# Patient Record
Sex: Female | Born: 1990 | Race: Black or African American | Hispanic: No | Marital: Single | Smoking: Never smoker
Health system: Southern US, Community
[De-identification: ages and names within clinical notes are randomized; demographics above are authoritative.]

## PROBLEM LIST (undated history)

## (undated) DIAGNOSIS — F4310 Post-traumatic stress disorder, unspecified: Secondary | ICD-10-CM

## (undated) DIAGNOSIS — F32A Depression, unspecified: Secondary | ICD-10-CM

## (undated) DIAGNOSIS — F840 Autistic disorder: Secondary | ICD-10-CM

## (undated) DIAGNOSIS — I10 Essential (primary) hypertension: Secondary | ICD-10-CM

## (undated) DIAGNOSIS — F419 Anxiety disorder, unspecified: Secondary | ICD-10-CM

## (undated) DIAGNOSIS — F909 Attention-deficit hyperactivity disorder, unspecified type: Secondary | ICD-10-CM

## (undated) DIAGNOSIS — K2970 Gastritis, unspecified, without bleeding: Secondary | ICD-10-CM

## (undated) HISTORY — DX: Essential (primary) hypertension: I10

## (undated) HISTORY — DX: Attention-deficit hyperactivity disorder, unspecified type: F90.9

## (undated) HISTORY — DX: Autistic disorder: F84.0

## (undated) HISTORY — DX: Depression, unspecified: F32.A

## (undated) HISTORY — DX: Anxiety disorder, unspecified: F41.9

## (undated) HISTORY — DX: Post-traumatic stress disorder, unspecified: F43.10

## (undated) HISTORY — DX: Gastritis, unspecified, without bleeding: K29.70

## (undated) HISTORY — PX: OTHER SURGICAL HISTORY: SHX169

---

## 2010-06-20 ENCOUNTER — Emergency Department (HOSPITAL_COMMUNITY): Admission: EM | Admit: 2010-06-20 | Discharge: 2010-06-20 | Payer: Self-pay | Admitting: Emergency Medicine

## 2010-12-21 LAB — URINALYSIS, ROUTINE W REFLEX MICROSCOPIC
Bilirubin Urine: NEGATIVE
Glucose, UA: NEGATIVE mg/dL
Hgb urine dipstick: NEGATIVE
Ketones, ur: NEGATIVE mg/dL
Nitrite: NEGATIVE
Protein, ur: NEGATIVE mg/dL
Specific Gravity, Urine: 1.021 (ref 1.005–1.030)
Urobilinogen, UA: 1 mg/dL (ref 0.0–1.0)
pH: 6 (ref 5.0–8.0)

## 2011-05-25 ENCOUNTER — Emergency Department (HOSPITAL_COMMUNITY)
Admission: EM | Admit: 2011-05-25 | Discharge: 2011-05-25 | Disposition: A | Discharge status: Home / Self Care | Payer: Medicaid Other | Attending: Emergency Medicine | Admitting: Emergency Medicine

## 2011-05-25 DIAGNOSIS — B9689 Other specified bacterial agents as the cause of diseases classified elsewhere: Secondary | ICD-10-CM | POA: Insufficient documentation

## 2011-05-25 DIAGNOSIS — N760 Acute vaginitis: Secondary | ICD-10-CM | POA: Insufficient documentation

## 2011-05-25 DIAGNOSIS — N739 Female pelvic inflammatory disease, unspecified: Secondary | ICD-10-CM | POA: Insufficient documentation

## 2011-05-25 DIAGNOSIS — A499 Bacterial infection, unspecified: Secondary | ICD-10-CM | POA: Insufficient documentation

## 2011-05-25 LAB — COMPREHENSIVE METABOLIC PANEL
Alkaline Phosphatase: 87 U/L (ref 39–117)
BUN: 9 mg/dL (ref 6–23)
CO2: 25 mEq/L (ref 19–32)
GFR calc Af Amer: >60 mL/min (ref >60)
GFR calc non Af Amer: >60 mL/min (ref >60)
Glucose, Bld: 84 mg/dL (ref 70–99)
Potassium: 3.6 mEq/L (ref 3.5–5.1)
Total Protein: 8.4 g/dL — ABNORMAL HIGH (ref 6.0–8.3)

## 2011-05-25 LAB — DIFFERENTIAL
Basophils Absolute: 0 10*3/uL (ref 0.0–0.1)
Basophils Relative: 0 % (ref 0–1)
Eosinophils Absolute: 0.1 10*3/uL (ref 0.0–0.7)
Eosinophils Relative: 1 % (ref 0–5)

## 2011-05-25 LAB — URINALYSIS, ROUTINE W REFLEX MICROSCOPIC
Nitrite: NEGATIVE
Protein, ur: NEGATIVE mg/dL
Specific Gravity, Urine: 1.021 (ref 1.005–1.030)
Urobilinogen, UA: 1 mg/dL (ref 0.0–1.0)

## 2011-05-25 LAB — WET PREP, GENITAL
Trich, Wet Prep: NONE SEEN
Yeast Wet Prep HPF POC: NONE SEEN

## 2011-05-25 LAB — POCT PREGNANCY, URINE: Preg Test, Ur: NEGATIVE

## 2011-05-25 LAB — CBC
Platelets: 336 10*3/uL (ref 150–400)
RDW: 13.3 % (ref 11.5–15.5)
WBC: 7.9 10*3/uL (ref 4.0–10.5)

## 2011-05-26 LAB — RPR: RPR Ser Ql: NONREACTIVE

## 2012-04-16 ENCOUNTER — Ambulatory Visit: Payer: Medicaid Other | Admitting: Physical Therapy

## 2012-04-17 ENCOUNTER — Ambulatory Visit: Payer: Medicaid Other | Attending: Family Medicine | Admitting: Physical Therapy

## 2012-04-17 DIAGNOSIS — M542 Cervicalgia: Secondary | ICD-10-CM | POA: Insufficient documentation

## 2012-04-17 DIAGNOSIS — M546 Pain in thoracic spine: Secondary | ICD-10-CM | POA: Insufficient documentation

## 2012-04-17 DIAGNOSIS — M2569 Stiffness of other specified joint, not elsewhere classified: Secondary | ICD-10-CM | POA: Insufficient documentation

## 2012-04-17 DIAGNOSIS — IMO0001 Reserved for inherently not codable concepts without codable children: Secondary | ICD-10-CM | POA: Insufficient documentation

## 2012-04-23 ENCOUNTER — Ambulatory Visit: Payer: Medicaid Other | Admitting: Physical Therapy

## 2012-04-24 ENCOUNTER — Ambulatory Visit: Payer: Medicaid Other | Admitting: Physical Therapy

## 2012-04-30 ENCOUNTER — Ambulatory Visit: Payer: Medicaid Other | Admitting: Physical Therapy

## 2012-05-09 ENCOUNTER — Ambulatory Visit: Payer: Medicaid Other | Attending: Family Medicine | Admitting: Physical Therapy

## 2012-05-09 DIAGNOSIS — M2569 Stiffness of other specified joint, not elsewhere classified: Secondary | ICD-10-CM | POA: Insufficient documentation

## 2012-05-09 DIAGNOSIS — IMO0001 Reserved for inherently not codable concepts without codable children: Secondary | ICD-10-CM | POA: Insufficient documentation

## 2012-05-09 DIAGNOSIS — M542 Cervicalgia: Secondary | ICD-10-CM | POA: Insufficient documentation

## 2012-05-09 DIAGNOSIS — M546 Pain in thoracic spine: Secondary | ICD-10-CM | POA: Insufficient documentation

## 2012-05-13 ENCOUNTER — Ambulatory Visit
Admission: RE | Admit: 2012-05-13 | Discharge: 2012-05-13 | Disposition: A | Discharge status: Home / Self Care | Payer: Medicaid Other | Source: Ambulatory Visit | Attending: Nurse Practitioner | Admitting: Nurse Practitioner

## 2012-05-13 ENCOUNTER — Other Ambulatory Visit: Payer: Self-pay | Admitting: Nurse Practitioner

## 2012-05-13 DIAGNOSIS — M533 Sacrococcygeal disorders, not elsewhere classified: Secondary | ICD-10-CM

## 2012-05-13 DIAGNOSIS — N6489 Other specified disorders of breast: Secondary | ICD-10-CM

## 2012-05-15 ENCOUNTER — Ambulatory Visit: Payer: Medicaid Other | Admitting: Physical Therapy

## 2012-05-21 ENCOUNTER — Ambulatory Visit: Payer: Medicaid Other | Admitting: Physical Therapy

## 2013-08-03 ENCOUNTER — Emergency Department (HOSPITAL_COMMUNITY)
Admission: EM | Admit: 2013-08-03 | Discharge: 2013-08-03 | Disposition: A | Discharge status: Home / Self Care | Payer: Medicaid Other | Attending: Emergency Medicine | Admitting: Emergency Medicine

## 2013-08-03 ENCOUNTER — Emergency Department (HOSPITAL_COMMUNITY): Payer: Medicaid Other

## 2013-08-03 ENCOUNTER — Encounter (HOSPITAL_COMMUNITY): Payer: Self-pay | Admitting: Emergency Medicine

## 2013-08-03 DIAGNOSIS — N898 Other specified noninflammatory disorders of vagina: Secondary | ICD-10-CM | POA: Insufficient documentation

## 2013-08-03 DIAGNOSIS — R1032 Left lower quadrant pain: Secondary | ICD-10-CM | POA: Insufficient documentation

## 2013-08-03 DIAGNOSIS — R1031 Right lower quadrant pain: Secondary | ICD-10-CM | POA: Insufficient documentation

## 2013-08-03 DIAGNOSIS — Z3202 Encounter for pregnancy test, result negative: Secondary | ICD-10-CM | POA: Insufficient documentation

## 2013-08-03 DIAGNOSIS — N83209 Unspecified ovarian cyst, unspecified side: Secondary | ICD-10-CM | POA: Insufficient documentation

## 2013-08-03 DIAGNOSIS — R102 Pelvic and perineal pain: Secondary | ICD-10-CM

## 2013-08-03 LAB — URINALYSIS, ROUTINE W REFLEX MICROSCOPIC
Bilirubin Urine: NEGATIVE
Glucose, UA: NEGATIVE mg/dL
Hgb urine dipstick: NEGATIVE
Ketones, ur: NEGATIVE mg/dL
Leukocytes, UA: NEGATIVE
Protein, ur: NEGATIVE mg/dL
pH: 6 (ref 5.0–8.0)

## 2013-08-03 LAB — WET PREP, GENITAL
Clue Cells Wet Prep HPF POC: NONE SEEN
Trich, Wet Prep: NONE SEEN
Yeast Wet Prep HPF POC: NONE SEEN

## 2013-08-03 MED ORDER — OXYCODONE-ACETAMINOPHEN 5-325 MG PO TABS: 1.0000 | ORAL_TABLET | ORAL | Status: DC | PRN

## 2013-08-03 MED ORDER — OXYCODONE-ACETAMINOPHEN 5-325 MG PO TABS
2.0000 | ORAL_TABLET | Freq: Once | ORAL | Status: AC
  Administered 2013-08-03: 2 via ORAL
  Filled 2013-08-03: qty 2

## 2013-08-03 NOTE — ED Notes (Signed)
Patient returning from u/s at this time 

## 2013-08-03 NOTE — ED Provider Notes (Signed)
CSN: [id number]     Arrival date & time 08/03/13  1248 History   First MD Initiated Contact with Patient 08/03/13 1558     Chief Complaint  Patient presents with  . Abdominal Pain  . Vaginal Discharge   (Consider location/radiation/quality/duration/timing/severity/associated sxs/prior Treatment) HPI  Kylie Huerta 22 year old female presents with chief complaint of R lower quadrant abdominal pain and vaginal discharge.  Patient states that she was diagnosed back in 2012 with bacterial vaginosis.  Patient states at that time she was told she may have pelvic inflammatory disease was only ever treated with Flagyl.  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  Her primary care physician continues to treat her with Flagyl.  Patient states that for the past 2 weeks she has had increasing left lower quadrants abdominal pain that is colicky, severe, worsening.  Patient denies any urinary symptoms.  She denies any fevers, chills, nausea, vomiting, myalgias.  Patient states she is actually active with one partner.   History reviewed. No pertinent past medical history. History reviewed. No pertinent past surgical history. History reviewed. No pertinent family history. History  Substance Use Topics  . Smoking status: Never Smoker   . Smokeless tobacco: Not on file  . Alcohol Use: No   OB History   Grav Para Term Preterm Abortions TAB SAB Ect Mult Living                 Review of Systems Ten systems reviewed and are negative for acute change, except as noted in the HPI.   Allergies  Review of patient's allergies indicates no known allergies.  Home Medications  No current outpatient prescriptions on file. BP 150/72  Pulse 100  Temp(Src) 98.3 F (36.8 C) (Oral)  Resp 18  Ht 5\' 8"  (1.727 m)  Wt 326 lb 3.2 oz (147.963 kg)  BMI 49.61 kg/m2  SpO2 99% Physical Exam Physical Exam  Nursing note and vitals  reviewed. Constitutional: She is oriented to person, place, and time. She appears well-developed and well-nourished. No distress.  HENT:  Head: Normocephalic and atraumatic.  Eyes: Conjunctivae normal and EOM are normal. Pupils are equal, round, and reactive to light. No scleral icterus.  Neck: Normal range of motion.  Cardiovascular: Normal rate, regular rhythm and normal heart sounds.  Exam reveals no gallop and no friction rub.   No murmur heard. Pulmonary/Chest: Effort normal and breath sounds normal. No respiratory distress.  Abdominal: Soft. Bowel sounds are normal. She exhibits no distension and no mass. There is no tenderness. There is no guarding.  Neurological: She is alert and oriented to person, place, and time.  Skin: Skin is warm and dry. She is not diaphoretic.  Pelvic exam: normal external genitalia, vulva, vagina, exam limited by obese abdomen.  She has adnexal tenderness No CMT.  There is no cervical discharge noted.   ED Course  Procedures (including critical care time) Labs Review Labs Reviewed  WET PREP, GENITAL - Abnormal; Notable for the following:    WBC, Wet Prep HPF POC FEW (*)    All other components within normal limits  GC/CHLAMYDIA PROBE AMP  URINALYSIS, ROUTINE W REFLEX MICROSCOPIC  POCT PREGNANCY, URINE   Imaging Review US Transvaginal Non-ob  08/03/2013   CLINICAL DATA:  Left thigh pain. Right tubo-ovarian abscess/torsion. Gravida 1 para 1. Previous C-section. LMP 07/14/2013.  EXAM: TRANSABDOMINAL AND TRANSVAGINAL ULTRASOUND OF PELVIS  DOPPLER ULTRASOUND OF OVARIES  TECHNIQUE: Both transabdominal and transvaginal ultrasound examinations of the pelvis were performed. Transabdominal technique was performed for global imaging of the pelvis including uterus, ovaries, adnexal regions, and pelvic cul-de-sac.  It was necessary to proceed with endovaginal exam following the transabdominal exam to visualize the uterus and ovaries. Color and duplex Doppler  ultrasound was utilized to evaluate blood flow to the ovaries.  COMPARISON:  None.  FINDINGS: Uterus  Measurements: 6.5 x 3.8 x 4.7 cm. No fibroids or other mass visualized.  Endometrium  Thickness: 12.9 mm.  No focal abnormality visualized.  Right ovary  Measurements: 2.5 x 1.8 x 1.5 cm. Normal appearance/no adnexal mass.  Left ovary  Measurements: 4.5 x 2.1 x 2.9 cm. A simple cyst is 2.0 x 1.6 x 2.0 cm. This may account for the patient's pain.  Pulsed Doppler evaluation of both ovaries demonstrates normal low-resistance arterial and venous waveforms.  Other findings  Trace free pelvic fluid.  IMPRESSION: 1. Small left ovarian cyst may account for the patient's symptoms. 2. No evidence for ovarian torsion. 3. Normal appearance of the uterus and right ovary.  No sonographic evidence for ovarian torsion.   Electronically Signed   By: Rosalie Gums M.D.   On: 08/03/2013 18:07   US Pelvis Complete  08/03/2013   CLINICAL DATA:  Left thigh pain. Right tubo-ovarian abscess/torsion. Gravida 1 para 1. Previous C-section. LMP 07/14/2013.  EXAM: TRANSABDOMINAL AND TRANSVAGINAL ULTRASOUND OF PELVIS  DOPPLER ULTRASOUND OF OVARIES  TECHNIQUE: Both transabdominal and transvaginal ultrasound examinations of the pelvis were performed. Transabdominal technique was performed for global imaging of the pelvis including uterus, ovaries, adnexal regions, and pelvic cul-de-sac.  It was necessary to proceed with endovaginal exam following the transabdominal exam to visualize the uterus and ovaries. Color and duplex Doppler ultrasound was utilized to evaluate blood flow to the ovaries.  COMPARISON:  None.  FINDINGS: Uterus  Measurements: 6.5 x 3.8 x 4.7 cm. No fibroids or other mass visualized.  Endometrium  Thickness: 12.9 mm.  No focal abnormality visualized.  Right ovary  Measurements: 2.5 x 1.8 x 1.5 cm. Normal appearance/no adnexal mass.  Left ovary  Measurements: 4.5 x 2.1 x 2.9 cm. A simple cyst is 2.0 x 1.6 x 2.0 cm. This may  account for the patient's pain.  Pulsed Doppler evaluation of both ovaries demonstrates normal low-resistance arterial and venous waveforms.  Other findings  Trace free pelvic fluid.  IMPRESSION: 1. Small left ovarian cyst may account for the patient's symptoms. 2. No evidence for ovarian torsion. 3. Normal appearance of the uterus and right ovary.  No sonographic evidence for ovarian torsion.   Electronically Signed   By: Rosalie Gums M.D.   On: 08/03/2013 18:07   [place] Art/ven Flow Abd Pelv Doppler  08/03/2013   CLINICAL DATA:  Left thigh pain. Right tubo-ovarian abscess/torsion. Gravida 1 para 1. Previous C-section. LMP 07/14/2013.  EXAM: TRANSABDOMINAL AND TRANSVAGINAL ULTRASOUND OF PELVIS  DOPPLER ULTRASOUND OF OVARIES  TECHNIQUE: Both transabdominal and transvaginal ultrasound examinations of the pelvis were performed. Transabdominal technique was performed for global imaging of the pelvis including uterus, ovaries, adnexal regions, and pelvic cul-de-sac.  It was necessary to proceed with endovaginal exam following the transabdominal exam to visualize the uterus and ovaries. Color and duplex Doppler ultrasound was utilized to evaluate blood flow to the ovaries.  COMPARISON:  None.  FINDINGS: Uterus  Measurements: 6.5 x 3.8 x 4.7 cm. No fibroids or other mass visualized.  Endometrium  Thickness: 12.9 mm.  No focal  abnormality visualized.  Right ovary  Measurements: 2.5 x 1.8 x 1.5 cm. Normal appearance/no adnexal mass.  Left ovary  Measurements: 4.5 x 2.1 x 2.9 cm. A simple cyst is 2.0 x 1.6 x 2.0 cm. This may account for the patient's pain.  Pulsed Doppler evaluation of both ovaries demonstrates normal low-resistance arterial and venous waveforms.  Other findings  Trace free pelvic fluid.  IMPRESSION: 1. Small left ovarian cyst may account for the patient's symptoms. 2. No evidence for ovarian torsion. 3. Normal appearance of the uterus and right ovary.  No sonographic evidence for ovarian torsion.    Electronically Signed   By: Rosalie Gums M.D.   On: 08/03/2013 18:07    EKG Interpretation   None       MDM   1. Pelvic pain   2. Ovarian cyst    Patient with ovarian cyst. I beieve this may account for her sxs. Patient is nontoxic, nonseptic appearing, in no apparent distress.  Patient's pain and other symptoms adequately managed in emergency department.  Fluid bolus given.  Labs, imaging and vitals reviewed.    On repeat exam patient does not have a surgical abdomin and there are nor peritoneal signs.  No indication of appendicitis, bowel obstruction, bowel perforation, PID or ectopic pregnancy.  Patient discharged home with symptomatic treatment and given strict instructions for follow-up with their primary care physician.  I have also discussed reasons to return immediately to the ER.  Patient expresses understanding and agrees with plan.       Arthor Captain, PA-C 08/05/13 2144

## 2013-08-03 NOTE — ED Notes (Signed)
Pt c/o left lower abd pain with white vaginal discharge with odor

## 2013-08-03 NOTE — ED Notes (Signed)
Pt states 2 years ago diagnosed with bacterial vaginosis. Pt states that she has been having recurrent infections. Pt states that 2 days ago she had a foul smell and discharge. Pt states that she is also having pain her her left ovary. Pt denies bleeding.

## 2013-08-04 LAB — GC/CHLAMYDIA PROBE AMP: GC Probe RNA: NEGATIVE

## 2013-08-06 NOTE — ED Provider Notes (Signed)
Medical screening examination/treatment/procedure(s) were performed by non-physician practitioner and as supervising physician I was immediately available for consultation/collaboration.  EKG Interpretation   None        Kiam Bransfield, MD 08/06/13 0721 

## 2014-04-28 ENCOUNTER — Emergency Department (HOSPITAL_COMMUNITY)
Admission: EM | Admit: 2014-04-28 | Discharge: 2014-04-28 | Disposition: A | Discharge status: Home / Self Care | Payer: Medicaid Other | Attending: Emergency Medicine | Admitting: Emergency Medicine

## 2014-04-28 ENCOUNTER — Encounter (HOSPITAL_COMMUNITY): Payer: Self-pay | Admitting: Emergency Medicine

## 2014-04-28 DIAGNOSIS — T7840XA Allergy, unspecified, initial encounter: Secondary | ICD-10-CM

## 2014-04-28 DIAGNOSIS — Z792 Long term (current) use of antibiotics: Secondary | ICD-10-CM | POA: Diagnosis not present

## 2014-04-28 DIAGNOSIS — R21 Rash and other nonspecific skin eruption: Secondary | ICD-10-CM | POA: Diagnosis present

## 2014-04-28 DIAGNOSIS — L258 Unspecified contact dermatitis due to other agents: Secondary | ICD-10-CM | POA: Insufficient documentation

## 2014-04-28 MED ORDER — FAMOTIDINE IN NACL 20-0.9 MG/50ML-% IV SOLN
20.0000 mg | Freq: Once | INTRAVENOUS | Status: AC
  Administered 2014-04-28: 20 mg via INTRAVENOUS
  Filled 2014-04-28: qty 50

## 2014-04-28 MED ORDER — DIPHENHYDRAMINE HCL 50 MG/ML IJ SOLN
25.0000 mg | Freq: Once | INTRAMUSCULAR | Status: AC
  Administered 2014-04-28: 25 mg via INTRAVENOUS
  Filled 2014-04-28: qty 1

## 2014-04-28 MED ORDER — DIPHENHYDRAMINE HCL 25 MG PO TABS: 25.0000 mg | ORAL_TABLET | Freq: Four times a day (QID) | ORAL | Status: DC | PRN

## 2014-04-28 MED ORDER — TRAMADOL HCL 50 MG PO TABS: 50.0000 mg | ORAL_TABLET | Freq: Four times a day (QID) | ORAL | Status: DC | PRN

## 2014-04-28 MED ORDER — FAMOTIDINE 40 MG PO TABS
20.0000 mg | ORAL_TABLET | Freq: Every day | ORAL | Status: DC
Start: 2014-04-28 — End: 2016-04-27

## 2014-04-28 MED ORDER — MORPHINE SULFATE 4 MG/ML IJ SOLN
4.0000 mg | Freq: Once | INTRAMUSCULAR | Status: AC
Start: 2014-04-28 — End: 2014-04-28
  Administered 2014-04-28: 4 mg via INTRAVENOUS
  Filled 2014-04-28: qty 1

## 2014-04-28 MED ORDER — METHYLPREDNISOLONE SODIUM SUCC 125 MG IJ SOLR
125.0000 mg | Freq: Once | INTRAMUSCULAR | Status: AC
  Administered 2014-04-28: 125 mg via INTRAVENOUS
  Filled 2014-04-28: qty 2

## 2014-04-28 MED ORDER — PREDNISONE 20 MG PO TABS: 40.0000 mg | ORAL_TABLET | Freq: Every day | ORAL | Status: DC

## 2014-04-28 MED ORDER — ONDANSETRON HCL 4 MG/2ML IJ SOLN
4.0000 mg | Freq: Once | INTRAMUSCULAR | Status: AC
  Administered 2014-04-28: 4 mg via INTRAVENOUS
  Filled 2014-04-28: qty 2

## 2014-04-28 MED ORDER — SODIUM CHLORIDE 0.9 % IV BOLUS (SEPSIS)
1000.0000 mL | Freq: Once | INTRAVENOUS | Status: AC
  Administered 2014-04-28: 1000 mL via INTRAVENOUS

## 2014-04-28 NOTE — ED Notes (Signed)
She is very happy that she feels better, and thanks us for our care.

## 2014-04-28 NOTE — Discharge Instructions (Signed)

## 2014-04-28 NOTE — ED Notes (Signed)
She states she had her hair dyed yesterday; and today noted her scalp to be "puffy" and she noted a pruritic, fine, raised rash about the periphery of her scalp.  She denies any trouble breathing, wheezing, nor any throat tightness.  She also denies any itching on any other part of her body than her scalp area.  Her skin is normal, warm and dry and she is breathing normally.

## 2014-04-28 NOTE — ED Notes (Signed)
Bed: WA17 Expected date:  Expected time:  Means of arrival:  Comments: EMS-allergic reaction

## 2014-04-28 NOTE — ED Provider Notes (Signed)
CSN: [id number]     Arrival date & time 04/28/14  1543 History   First MD Initiated Contact with Patient 04/28/14 1553     Chief Complaint  Patient presents with  . Allergic Reaction     (Consider location/radiation/quality/duration/timing/severity/associated sxs/prior Treatment) HPI Comments: Patient is a 23 year old female who presents today after having an allergic reaction to her hair dye. She states that she used [place] hair dye on her hair yesterday. She has been having gradually worsening pain to her scalp. She is uses this dye in the past without issue. Today she noticed that her eyes felt puffy. Her scalp is painful and itchy. She denies any shortness of breath, sensation that her throat is closing. She took ibuprofen prior to arrival without relief. She did not have a rash anywhere on her skin.  Patient is a 23 y.o. female presenting with allergic reaction. The history is provided by the patient. No language interpreter was used.  Allergic Reaction Presenting symptoms: rash   Presenting symptoms: no difficulty swallowing     History reviewed. No pertinent past medical history. No past surgical history on file. No family history on file. History  Substance Use Topics  . Smoking status: Never Smoker   . Smokeless tobacco: Not on file  . Alcohol Use: No   OB History   Grav Para Term Preterm Abortions TAB SAB Ect Mult Living                 Review of Systems  Constitutional: Negative for fever and chills.  HENT: Negative for trouble swallowing.   Respiratory: Negative for shortness of breath.   Cardiovascular: Negative for chest pain.  Skin: Positive for rash.  All other systems reviewed and are negative.     Allergies  Almond oil and Other  Home Medications   Prior to Admission medications   Medication Sig Start Date End Date Taking? Authorizing Provider  acetaminophen-codeine (TYLENOL #3) 300-30 MG per tablet Take 1 tablet by mouth every 6 (six) hours as  needed for moderate pain.    Yes Historical Provider, MD  amoxicillin (AMOXIL) 500 MG capsule Take 500 mg by mouth 3 (three) times daily. For 10 days. 04/23/14  Yes Historical Provider, MD  ibuprofen (ADVIL,MOTRIN) 800 MG tablet Take 800 mg by mouth every 6 (six) hours as needed for mild pain.   Yes Historical Provider, MD   BP 147/94  Pulse 78  Temp(Src) 98 F (36.7 C) (Oral)  Resp 14  SpO2 97%  LMP 04/14/2014 Physical Exam  Nursing note and vitals reviewed. Constitutional: She is oriented to person, place, and time. She appears well-developed and well-nourished. No distress.  Morbidly obese  HENT:  Head: Normocephalic and atraumatic.  Right Ear: External ear normal.  Left Ear: External ear normal.  Nose: Nose normal.  Mouth/Throat: Uvula is midline and oropharynx is clear and moist. No trismus in the jaw. No uvula swelling.  Erythema with mild overlying papules. This does not extend to ears. No superimposed infection. TTP in scalp. Minimal swelling to eyes. No uvula swelling. Easily maintaining secretions.   Eyes: Conjunctivae are normal.  Neck: Normal range of motion.  Cardiovascular: Normal rate, regular rhythm and normal heart sounds.   Pulmonary/Chest: Effort normal and breath sounds normal. No stridor. No respiratory distress. She has no wheezes. She has no rales.  Speaking in full sentences  Abdominal: Soft. She exhibits no distension.  Musculoskeletal: Normal range of motion.  Neurological: She is alert and oriented to person,  place, and time. She has normal strength.  Skin: Skin is warm and dry. She is not diaphoretic. No erythema.  Psychiatric: She has a normal mood and affect. Her behavior is normal.    ED Course  Procedures (including critical care time) Labs Review Labs Reviewed - No data to display  Imaging Review No results found.   EKG Interpretation   Date/Time:  Wednesday April 28 2014 15:47:43 EDT Ventricular Rate:  96 PR Interval:  167 QRS Duration:  76 QT Interval:  352 QTC Calculation: 445 R Axis:   87 Text Interpretation:  Sinus rhythm No previous tracing Confirmed by KNAPP   MD-J, JON ([id number]) on 04/28/2014 4:06:40 PM      MDM   Final diagnoses:  Allergic reaction, initial encounter    Patient re-evaluated prior to dc, is hemodynamically stable, in no respiratory distress, and denies the feeling of throat closing. Patient feels significantly improved after prednisone, pepcid, and benadryl. will given home meds. Pt has been advised to return to the ED if they have a mod-severe allergic rxn (s/s including throat closing, difficulty breathing, swelling of lips face or tongue). Pt is to follow up with their PCP. Advised to not use this hair dye again. Pt is agreeable with plan & verbalizes understanding.   Mora Bellman, PA-C 04/28/14 1820

## 2014-04-28 NOTE — ED Notes (Signed)
She smilingly tells me she feels "much better" and that her "head doesn't feel so 'tight'"

## 2014-04-29 NOTE — ED Provider Notes (Signed)
Medical screening examination/treatment/procedure(s) were performed by non-physician practitioner and as supervising physician I was immediately available for consultation/collaboration.  Toy BakerAnthony T Laikyn Gewirtz, MD 04/29/14 2132

## 2014-04-30 ENCOUNTER — Emergency Department (HOSPITAL_COMMUNITY)
Admission: EM | Admit: 2014-04-30 | Discharge: 2014-04-30 | Discharge status: Against Medical Advice | Payer: Medicaid Other | Attending: Emergency Medicine | Admitting: Emergency Medicine

## 2014-04-30 ENCOUNTER — Encounter (HOSPITAL_COMMUNITY): Payer: Self-pay | Admitting: Emergency Medicine

## 2014-04-30 DIAGNOSIS — T4995XA Adverse effect of unspecified topical agent, initial encounter: Secondary | ICD-10-CM | POA: Insufficient documentation

## 2014-04-30 DIAGNOSIS — R21 Rash and other nonspecific skin eruption: Secondary | ICD-10-CM | POA: Insufficient documentation

## 2014-04-30 DIAGNOSIS — Z792 Long term (current) use of antibiotics: Secondary | ICD-10-CM | POA: Diagnosis not present

## 2014-04-30 DIAGNOSIS — R221 Localized swelling, mass and lump, neck: Secondary | ICD-10-CM | POA: Diagnosis not present

## 2014-04-30 DIAGNOSIS — IMO0002 Reserved for concepts with insufficient information to code with codable children: Secondary | ICD-10-CM | POA: Diagnosis not present

## 2014-04-30 DIAGNOSIS — Z79899 Other long term (current) drug therapy: Secondary | ICD-10-CM | POA: Insufficient documentation

## 2014-04-30 DIAGNOSIS — R220 Localized swelling, mass and lump, head: Secondary | ICD-10-CM | POA: Insufficient documentation

## 2014-04-30 DIAGNOSIS — T7840XD Allergy, unspecified, subsequent encounter: Secondary | ICD-10-CM

## 2014-04-30 MED ORDER — METHYLPREDNISOLONE SODIUM SUCC 125 MG IJ SOLR
125.0000 mg | Freq: Once | INTRAMUSCULAR | Status: DC
  Filled 2014-04-30: qty 2

## 2014-04-30 MED ORDER — FAMOTIDINE 20 MG PO TABS
20.0000 mg | ORAL_TABLET | Freq: Once | ORAL | Status: DC
  Filled 2014-04-30 (×2): qty 1

## 2014-04-30 MED ORDER — DIPHENHYDRAMINE HCL 25 MG PO CAPS
50.0000 mg | ORAL_CAPSULE | Freq: Once | ORAL | Status: DC
  Filled 2014-04-30 (×2): qty 2

## 2014-04-30 NOTE — ED Notes (Signed)
Patient refused medication. Dr. Ranae PalmsYelverton notified.

## 2014-04-30 NOTE — ED Notes (Signed)
Per pt report: pt dyed her hair on Monday night and symptoms began Tuesday night.  Pt was seen on Wednesday for her allergic reaction and was discharged.  This afternoon pt woke up around 14:00 with her eyes swollen. Pt took the prescribed Pepcid, benadryl, prednisone, and tramadol to help with her symptoms.  Swelling in her eyelids have become worse.  Pt talking in complete sentences without any difficulty. Pt's spouse reports the quality of her voice is unchanged.

## 2014-04-30 NOTE — ED Notes (Signed)
Patient states that the left eye has been draining and she now has difficulty breathing out of her nose and this did not happen with the initial reaction.

## 2014-04-30 NOTE — ED Notes (Signed)
Pt refused medications because she stated they do not work. Pt states she has washed her hair multiple times to remove dye and still experiencing allergic reaction. Pt informed that Pepcid, Benadryl and Solumedrol are what is used in the ED to treat allergic reaction. Pt continued to refused medication and demanded to speak with MD and left AMA before MD could speak with her.

## 2014-04-30 NOTE — ED Provider Notes (Signed)
CSN: [id number]     Arrival date & time 04/30/14  0114 History   First MD Initiated Contact with Patient 04/30/14 [phone]     Chief Complaint  Patient presents with  . Allergic Reaction     (Consider location/radiation/quality/duration/timing/severity/associated sxs/prior Treatment) HPI Patient was seen on Wednesday and diagnosed with contact dermatitis and generalized allergic reaction to the face. Call to be due to a new hair dye she started using a Monday. She had no intraoral swelling, stridor or sensation of shortness of breath. No previous history of same. Was given prednisone Pepcid and Benadryl with improvement of his symptoms at that time. Patient states that since her discharge she had increased swelling in her face. She last used Benadryl 24 hours prior. She continues to have no respiratory involvement. She states she did wash her hair out of the time and is unsure of any other allergens she may have been exposed to. She admits to itching of the rash denies any fevers or chills. History reviewed. No pertinent past medical history. History reviewed. No pertinent past surgical history. No family history on file. History  Substance Use Topics  . Smoking status: Never Smoker   . Smokeless tobacco: Not on file  . Alcohol Use: No   OB History   Grav Para Term Preterm Abortions TAB SAB Ect Mult Living                 Review of Systems  Unable to perform ROS Constitutional: Negative for fever and chills.  HENT: Positive for facial swelling.   Respiratory: Negative for shortness of breath, wheezing and stridor.   Cardiovascular: Negative for chest pain, palpitations and leg swelling.  Gastrointestinal: Negative for nausea, vomiting, abdominal pain and diarrhea.  Musculoskeletal: Negative for neck pain and neck stiffness.  Skin: Positive for rash. Negative for pallor and wound.  Neurological: Negative for dizziness, syncope, weakness, light-headedness, numbness and headaches.  All  other systems reviewed and are negative.     Allergies  Almond oil; Other; and Kiwi extract  Home Medications   Prior to Admission medications   Medication Sig Start Date End Date Taking? Authorizing Provider  acetaminophen-codeine (TYLENOL #3) 300-30 MG per tablet Take 1 tablet by mouth every 6 (six) hours as needed for moderate pain.    Yes Historical Provider, MD  amoxicillin (AMOXIL) 500 MG capsule Take 500 mg by mouth 3 (three) times daily. For 10 days. 04/23/14  Yes Historical Provider, MD  diphenhydrAMINE (BENADRYL) 25 MG tablet Take 1 tablet (25 mg total) by mouth every 6 (six) hours as needed for itching (Rash). 04/28/14  Yes Mora Bellman, PA-C  famotidine (PEPCID) 40 MG tablet Take 0.5 tablets (20 mg total) by mouth daily. OTC 04/28/14  Yes Mora Bellman, PA-C  ibuprofen (ADVIL,MOTRIN) 800 MG tablet Take 800 mg by mouth every 6 (six) hours as needed for mild pain.   Yes Historical Provider, MD  predniSONE (DELTASONE) 20 MG tablet Take 2 tablets (40 mg total) by mouth daily. 04/28/14  Yes Mora Bellman, PA-C  traMADol (ULTRAM) 50 MG tablet Take by mouth every 6 (six) hours as needed for moderate pain.   Yes Historical Provider, MD   BP 143/92  Pulse 81  Temp(Src) 98 F (36.7 C) (Oral)  Resp 18  SpO2 100%  LMP 04/14/2014 Physical Exam  Nursing note and vitals reviewed. Constitutional: She is oriented to person, place, and time. She appears well-developed and well-nourished. No distress.  HENT:  Head: Normocephalic  and atraumatic.  Mouth/Throat: Oropharynx is clear and moist.  Diffuse upper face and periorbital swelling. Patient has no lip or intraoral swelling. She speaks in a clear voice. Patient noted to have multiple papules of the scalp base and neck consistent with contact dermatitis. There is no obvious super infection.  Eyes: EOM are normal. Pupils are equal, round, and reactive to light.  Neck: Normal range of motion. Neck supple.  Cardiovascular: Normal rate  and regular rhythm.   Pulmonary/Chest: Effort normal and breath sounds normal. No stridor. No respiratory distress. She has no wheezes. She has no rales. She exhibits no tenderness.  Abdominal: Soft. Bowel sounds are normal. She exhibits no distension and no mass. There is no tenderness. There is no rebound and no guarding.  Musculoskeletal: Normal range of motion. She exhibits no edema and no tenderness.  Neurological: She is alert and oriented to person, place, and time.  Nasal extremities without deficit. Sensation is grossly intact.  Skin: Skin is warm and dry. No rash noted. No erythema.  Psychiatric: She has a normal mood and affect. Her behavior is normal.    ED Course  Procedures (including critical care time) Labs Review Labs Reviewed - No data to display  Imaging Review No results found.   EKG Interpretation None      MDM   Final diagnoses:  None    Patient with allergen exposure which is likely due to her hair dye. She states she's washed out but continues to have allergic reaction to it. She's not taking Benadryl recently. No free dose steroids Benadryl and Pepcid. There is no airway compromise. Patient is well-appearing.  Patient refused steroids, Benadryl and Pepcid. She left AMA prior to any discharge instructions.    Loren Raceravid Chistine Dematteo, MD 04/30/14 [id number]

## 2014-10-19 ENCOUNTER — Encounter (HOSPITAL_COMMUNITY): Payer: Self-pay | Admitting: Emergency Medicine

## 2014-10-19 ENCOUNTER — Emergency Department (HOSPITAL_COMMUNITY)
Admission: EM | Admit: 2014-10-19 | Discharge: 2014-10-19 | Disposition: A | Discharge status: Home / Self Care | Payer: Medicaid Other | Attending: Emergency Medicine | Admitting: Emergency Medicine

## 2014-10-19 DIAGNOSIS — Z79899 Other long term (current) drug therapy: Secondary | ICD-10-CM | POA: Diagnosis not present

## 2014-10-19 DIAGNOSIS — Z7952 Long term (current) use of systemic steroids: Secondary | ICD-10-CM | POA: Diagnosis not present

## 2014-10-19 DIAGNOSIS — H66001 Acute suppurative otitis media without spontaneous rupture of ear drum, right ear: Secondary | ICD-10-CM | POA: Insufficient documentation

## 2014-10-19 DIAGNOSIS — H9201 Otalgia, right ear: Secondary | ICD-10-CM | POA: Diagnosis present

## 2014-10-19 DIAGNOSIS — J029 Acute pharyngitis, unspecified: Secondary | ICD-10-CM | POA: Diagnosis not present

## 2014-10-19 MED ORDER — AMOXICILLIN-POT CLAVULANATE 875-125 MG PO TABS
1.0000 | ORAL_TABLET | Freq: Once | ORAL | Status: AC
  Administered 2014-10-19: 1 via ORAL
  Filled 2014-10-19: qty 1

## 2014-10-19 MED ORDER — AMOXICILLIN-POT CLAVULANATE 875-125 MG PO TABS: 1.0000 | ORAL_TABLET | Freq: Two times a day (BID) | ORAL | Status: DC

## 2014-10-19 MED ORDER — HYDROCODONE-ACETAMINOPHEN 5-325 MG PO TABS: 1.0000 | ORAL_TABLET | Freq: Four times a day (QID) | ORAL | Status: DC | PRN

## 2014-10-19 MED ORDER — HYDROCODONE-ACETAMINOPHEN 5-325 MG PO TABS
1.0000 | ORAL_TABLET | Freq: Once | ORAL | Status: AC
  Administered 2014-10-19: 1 via ORAL
  Filled 2014-10-19: qty 1

## 2014-10-19 NOTE — Discharge Instructions (Signed)
[  please take the medication as directed  Make an appointment with your PCP for 2 weeks from now for a recheck  You have also been given a referral to ENT if needed

## 2014-10-19 NOTE — ED Notes (Signed)
Pt c/o right ear pain with lime green drainage per pt x 2 days.

## 2014-10-19 NOTE — ED Provider Notes (Signed)
CSN: [id number]     Arrival date & time 10/19/14  1740 History  This chart was scribed for non-physician practitioner, Arman FilterGail K Sharmain Lastra, NP, working with Purvis SheffieldForrest Harrison, MD, by Modena JanskyAlbert Thayil, ED Scribe. This patient was seen in room WTR6/WTR6 and the patient's care was started at 8:24 PM.   Chief Complaint  Patient presents with  . Otalgia   The history is provided by the patient. No language interpreter was used.   HPI Comments: Linward Natalmani Y Jaeger is a 24 y.o. female who presents to the Emergency Department complaining of constant moderate right ear pain that started 2 days ago. She states that she suspects that she has a ear infection because of her right ear is draining green fluid with pain. She reports that she has been having a sore throat as well.   History reviewed. No pertinent past medical history. History reviewed. No pertinent past surgical history. No family history on file. History  Substance Use Topics  . Smoking status: Never Smoker   . Smokeless tobacco: Not on file  . Alcohol Use: No   OB History    No data available     Review of Systems  Constitutional: Negative for fever and chills.  HENT: Positive for ear discharge, ear pain and sore throat. Negative for rhinorrhea and trouble swallowing.   Gastrointestinal: Negative for nausea.  Neurological: Positive for headaches. Negative for dizziness.  All other systems reviewed and are negative.    Allergies  Almond oil; Other; and Kiwi extract  Home Medications   Prior to Admission medications   Medication Sig Start Date End Date Taking? Authorizing Provider  amoxicillin (AMOXIL) 500 MG capsule Take 500 mg by mouth once. For 10 days. 04/23/14  Yes Historical Provider, MD  diphenhydrAMINE (BENADRYL) 25 MG tablet Take 1 tablet (25 mg total) by mouth every 6 (six) hours as needed for itching (Rash). 04/28/14  Yes Mora BellmanHannah S Merrell, PA-C  ibuprofen (ADVIL,MOTRIN) 800 MG tablet Take 800 mg by mouth every 6 (six) hours as  needed for mild pain.   Yes Historical Provider, MD  OVER THE COUNTER MEDICATION Place 1-2 drops into both eyes daily as needed (dry eyes).   Yes Historical Provider, MD  famotidine (PEPCID) 40 MG tablet Take 0.5 tablets (20 mg total) by mouth daily. OTC Patient not taking: Reported on 10/19/2014 04/28/14   Mora BellmanHannah S Merrell, PA-C  predniSONE (DELTASONE) 20 MG tablet Take 2 tablets (40 mg total) by mouth daily. Patient not taking: Reported on 10/19/2014 04/28/14   Mora BellmanHannah S Merrell, PA-C   BP 144/84 mmHg  Pulse 86  Temp(Src) 98.5 F (36.9 C) (Oral)  Resp 22  SpO2 99%  LMP 09/24/2014 Physical Exam  Constitutional: She is oriented to person, place, and time. She appears well-developed and well-nourished. No distress.  HENT:  Head: Normocephalic and atraumatic.  Right Ear: There is drainage, swelling and tenderness. No mastoid tenderness.  Left Ear: External ear normal.  Eyes: Pupils are equal, round, and reactive to light.  Neck: Normal range of motion. Neck supple.  Cardiovascular: Normal rate.   Pulmonary/Chest: Effort normal.  Musculoskeletal: Normal range of motion.  Lymphadenopathy:    She has cervical adenopathy.  Neurological: She is alert and oriented to person, place, and time.  Skin: Skin is warm and dry.  Psychiatric: She has a normal mood and affect. Her behavior is normal.  Nursing note and vitals reviewed.   ED Course  Procedures (including critical care time) DIAGNOSTIC STUDIES: Oxygen Saturation is  99% on RA, normal by my interpretation.    COORDINATION OF CARE: 8:28 PM- Pt advised of plan for treatment and pt agrees.  Labs Review Labs Reviewed - No data to display  Imaging Review No results found.   EKG Interpretation None      MDM  Rx fro augmentin and hydrocodone, FU with PCP   ENT if needed  Final diagnoses:  None     I personally performed the services described in this documentation, which was scribed in my presence. The recorded information  has been reviewed and is accurate.    Arman Filter, NP 10/19/14 2037  Purvis Sheffield, MD 10/20/14 [phone]

## 2014-10-21 ENCOUNTER — Encounter (HOSPITAL_COMMUNITY): Payer: Self-pay | Admitting: Emergency Medicine

## 2014-10-21 ENCOUNTER — Emergency Department (HOSPITAL_COMMUNITY)
Admission: EM | Admit: 2014-10-21 | Discharge: 2014-10-21 | Disposition: A | Discharge status: Home / Self Care | Payer: Medicaid Other | Attending: Emergency Medicine | Admitting: Emergency Medicine

## 2014-10-21 DIAGNOSIS — Z792 Long term (current) use of antibiotics: Secondary | ICD-10-CM | POA: Diagnosis not present

## 2014-10-21 DIAGNOSIS — H9201 Otalgia, right ear: Secondary | ICD-10-CM

## 2014-10-21 DIAGNOSIS — H9190 Unspecified hearing loss, unspecified ear: Secondary | ICD-10-CM | POA: Diagnosis not present

## 2014-10-21 DIAGNOSIS — R509 Fever, unspecified: Secondary | ICD-10-CM | POA: Insufficient documentation

## 2014-10-21 DIAGNOSIS — H6091 Unspecified otitis externa, right ear: Secondary | ICD-10-CM | POA: Insufficient documentation

## 2014-10-21 MED ORDER — OFLOXACIN 0.3 % OT SOLN: 10.0000 [drp] | Freq: Two times a day (BID) | OTIC | Status: DC

## 2014-10-21 MED ORDER — OXYCODONE-ACETAMINOPHEN 5-325 MG PO TABS: 2.0000 | ORAL_TABLET | ORAL | Status: DC | PRN

## 2014-10-21 MED ORDER — OXYCODONE-ACETAMINOPHEN 5-325 MG PO TABS
1.0000 | ORAL_TABLET | Freq: Once | ORAL | Status: AC
  Administered 2014-10-21: 1 via ORAL
  Filled 2014-10-21: qty 1

## 2014-10-21 NOTE — ED Provider Notes (Signed)
CSN: [id number]     Arrival date & time 10/21/14  [id number] History   First MD Initiated Contact with Patient 10/21/14 [phone]     Chief Complaint  Patient presents with  . Otalgia   HPI  Patient is a 24 year old female with no past medical history who presents emergency room for evaluation of right ear pain that started approximately 4-5 days ago. Patient states that she was swimming before her onset of pain. Since the onset of her pain she is had drainage from her right ear that looks like it is lime green in color. She also feels that her right ear is swelling. She was seen in the emergency room on the 12th of this month. She is diagnosed with an ear infection at that time and was given Augmentin and hydrocodone for pain. She filled her Augmentin and started taking it yesterday. She is taking 2 doses. Patient feels that the hydrocodone only helps for short amount of time. She is also been trying heating pads at home with little relief. She states that her pain is a 10 out of 10 at this time. He denies prior history of frequent ear infections or problems with her ears. She feels that pressure makes her ears feel significantly worse. Patient does admit to a fever at home with a maximum temperature of 102. Patient states that this went away with Tylenol.  History reviewed. No pertinent past medical history. History reviewed. No pertinent past surgical history. No family history on file. History  Substance Use Topics  . Smoking status: Never Smoker   . Smokeless tobacco: Not on file  . Alcohol Use: No   OB History    No data available     Review of Systems  Constitutional: Positive for fever. Negative for chills and fatigue.  HENT: Positive for ear discharge, ear pain and hearing loss. Negative for nosebleeds, rhinorrhea, sinus pressure, sneezing, sore throat and trouble swallowing.   Eyes: Negative for visual disturbance.  Respiratory: Negative for cough, chest tightness and shortness of breath.    Neurological: Negative for dizziness and headaches.  All other systems reviewed and are negative.     Allergies  Almond oil; Other; and Kiwi extract  Home Medications   Prior to Admission medications   Medication Sig Start Date End Date Taking? Authorizing Provider  amoxicillin-clavulanate (AUGMENTIN) 875-125 MG per tablet Take 1 tablet by mouth 2 (two) times daily. 10/19/14  Yes Arman FilterGail K Schulz, NP  HYDROcodone-acetaminophen (NORCO/VICODIN) 5-325 MG per tablet Take 1 tablet by mouth every 6 (six) hours as needed for moderate pain. 10/19/14  Yes Arman FilterGail K Schulz, NP  ibuprofen (ADVIL,MOTRIN) 800 MG tablet Take 800 mg by mouth every 6 (six) hours as needed for mild pain.   Yes Historical Provider, MD  diphenhydrAMINE (BENADRYL) 25 MG tablet Take 1 tablet (25 mg total) by mouth every 6 (six) hours as needed for itching (Rash). 04/28/14   Mora BellmanHannah S Merrell, PA-C  famotidine (PEPCID) 40 MG tablet Take 0.5 tablets (20 mg total) by mouth daily. OTC Patient not taking: Reported on 10/19/2014 04/28/14   Mora BellmanHannah S Merrell, PA-C  ofloxacin (FLOXIN) 0.3 % otic solution Place 10 drops into the right ear 2 (two) times daily. 10/21/14   Khristian Phillippi A Forcucci, PA-C  oxyCODONE-acetaminophen (PERCOCET) 5-325 MG per tablet Take 2 tablets by mouth every 4 (four) hours as needed. 10/21/14   Danaka Llera A Forcucci, PA-C  predniSONE (DELTASONE) 20 MG tablet Take 2 tablets (40 mg total) by mouth daily.  Patient not taking: Reported on 10/19/2014 04/28/14   Mora Bellman, PA-C   BP 121/62 mmHg  Pulse 78  Temp(Src) 98 F (36.7 C) (Oral)  Resp 16  Ht  (1.803 m)  Wt 297 lb (134.718 kg)  BMI 41.44 kg/m2  SpO2 97%  LMP 09/24/2014 Physical Exam  Constitutional: She is oriented to person, place, and time. She appears well-developed and well-nourished. No distress.  HENT:  Head: Normocephalic and atraumatic.  Mouth/Throat: Oropharynx is clear and moist. No oropharyngeal exudate.  There is swelling of the right year  along the auricle. There is obvious drainage located along the eardrum that appears to be a light pale sprain. The ear canal is swollen and exquisitely tender. Patient cannot tolerate full ear exam secondary to pain. I am unable to visualize the right tympanic membrane at this time. There is no evidence for mastoiditis or mastoid swelling at this time.  Eyes: Conjunctivae and EOM are normal. Pupils are equal, round, and reactive to light. No scleral icterus.  Neck: Normal range of motion. Neck supple. No JVD present. No thyromegaly present.  Cardiovascular: Normal rate, regular rhythm, normal heart sounds and intact distal pulses.  Exam reveals no gallop and no friction rub.   No murmur heard. Pulmonary/Chest: Effort normal and breath sounds normal. No respiratory distress. She has no wheezes. She has no rales. She exhibits no tenderness.  Musculoskeletal: Normal range of motion.  Lymphadenopathy:    She has cervical adenopathy (right-sided).  Neurological: She is alert and oriented to person, place, and time.  Skin: Skin is warm and dry. She is not diaphoretic.  Psychiatric: She has a normal mood and affect. Her behavior is normal. Judgment and thought content normal.  Nursing note and vitals reviewed.   ED Course  Procedures (including critical care time) Labs Review Labs Reviewed - No data to display  Imaging Review No results found.   EKG Interpretation None      MDM   Final diagnoses:  Otalgia, right  Right otitis externa   Patient is a 24 year old female who presents emergency room for evaluation of right ear pain. Vital signs are stable and the patient is alert and nontoxic-appearing. There is no evidence for sepsis at this time. There is swelling to the right ear and obvious ear discharge. The ear canal is exquisitely tender and swollen. Differential includes otitis externa, otitis media. There is no evidence for mastoiditis at this time. Patient does not have any trismus. I  do not appreciate any swelling of the neck. Patient is already taking Augmentin and has taken 2 doses. I will discontinue hydrocodone and will pump pain medicine to Percocet. I will also start on ciprofloxacin drops for otitis externa. Will not use steroids as I cannot visualize the eardrum and must assume that it is perforated without direct visualization. We'll refer to Dr. Pollyann Kennedy for ear nose and throat. Patient to follow-up with him. Patient to return for trismus, neck swelling, swelling of the mastoid process, or any other concerning symptoms. She states understanding and agreement at this time. Patient to follow-up with Dr. Pollyann Kennedy. Patient discussed with Dr. Norlene Campbell who for the above workup and plan. Patient is stable for discharge at this time.   Eben Burow, PA-C 10/21/14 4098  Olivia Mackie, MD 10/21/14 [phone]

## 2014-10-21 NOTE — ED Notes (Signed)
Per Patient: Reports that her R ear began to swell and hurt approx. 3 days ago. Noticeable swelling to R ear. Pt reports pain radiates to jaw and neck. Pt is ax4, NAD.

## 2014-10-21 NOTE — Discharge Instructions (Signed)
Draining Ear °Ear wax, pus, blood and other fluids are examples of the different types of drainage from ears. Drops or cream may be needed to lessen the itching which may occur with ear drainage. °CAUSES  °· Skin irritations in the ear. °· Ear infection. °· Swimmer's ear. °· Ruptured eardrum. °· Foreign object in the ear canal. °· Sudden pressure changes. °· Head injury. °HOME CARE INSTRUCTIONS  °· Only take over-the-counter or prescription medicines for pain, fever, or discomfort as directed by your caregiver. °· Do not rub the ear canal with cotton-tipped swabs. °· Do not swim until your caregiver says it is okay. °· Before you take a shower, cover a cotton ball with petroleum jelly to keep water out. °· Limit exposure to smoke. Secondhand smoke can increase the chance for ear infections. °· Keep up with immunizations. °· Wash your hands well. °· Keep all follow-up appointments to examine the ear and evaluate hearing. °SEEK MEDICAL CARE IF:  °· You have increased drainage. °· You have ear pain, a fever, or drainage that is not getting better after 48 hours of antibiotics. °· You are unusually tired. °SEEK IMMEDIATE MEDICAL CARE IF: °· You have severe ear pain or headache. °· The patient is older than 3 months with a rectal or oral temperature of 102° F (38.9° C) or higher. °· The patient is 3 months old or younger with a rectal temperature of 100.4° F (38° C) or higher. °· You vomit. °· You feel dizzy. °· You have a seizure. °· You have new hearing loss. °MAKE SURE YOU:  °· Understand these instructions. °· Will watch your condition. °· Will get help right away if you are not doing well or get worse. °Document Released: 09/24/2005 Document Revised: 12/17/2011 Document Reviewed: 07/28/2009 °[employer]® Patient Information ©2015 [employer]. This information is not intended to replace advice given to you by your health care provider. Make sure you discuss any questions you have with your health care  provider. ° °Otitis Externa °Otitis externa is a bacterial or fungal infection of the outer ear canal. This is the area from the eardrum to the outside of the ear. Otitis externa is sometimes called "swimmer's ear." °CAUSES  °Possible causes of infection include: °· Swimming in dirty water. °· Moisture remaining in the ear after swimming or bathing. °· Mild injury (trauma) to the ear. °· Objects stuck in the ear (foreign body). °· Cuts or scrapes (abrasions) on the outside of the ear. °SIGNS AND SYMPTOMS  °The first symptom of infection is often itching in the ear canal. Later signs and symptoms may include swelling and redness of the ear canal, ear pain, and yellowish-white fluid (pus) coming from the ear. The ear pain may be worse when pulling on the earlobe. °DIAGNOSIS  °Your health care provider will perform a physical exam. A sample of fluid may be taken from the ear and examined for bacteria or fungi. °TREATMENT  °Antibiotic ear drops are often given for 10 to 14 days. Treatment may also include pain medicine or corticosteroids to reduce itching and swelling. °HOME CARE INSTRUCTIONS  °· Apply antibiotic ear drops to the ear canal as prescribed by your health care provider. °· Take medicines only as directed by your health care provider. °· If you have diabetes, follow any additional treatment instructions from your health care provider. °· Keep all follow-up visits as directed by your health care provider. °PREVENTION  °· Keep your ear dry. Use the corner of a towel to absorb   water out of the ear canal after swimming or bathing. °· Avoid scratching or putting objects inside your ear. This can damage the ear canal or remove the protective wax that lines the canal. This makes it easier for bacteria and fungi to grow. °· Avoid swimming in lakes, polluted water, or poorly chlorinated pools. °· You may use ear drops made of rubbing alcohol and vinegar after swimming. Combine equal parts of white vinegar and alcohol  in a bottle. Put 3 or 4 drops into each ear after swimming. °SEEK MEDICAL CARE IF:  °· You have a fever. °· Your ear is still red, swollen, painful, or draining pus after 3 days. °· Your redness, swelling, or pain gets worse. °· You have a severe headache. °· You have redness, swelling, pain, or tenderness in the area behind your ear. °MAKE SURE YOU:  °· Understand these instructions. °· Will watch your condition. °· Will get help right away if you are not doing well or get worse. °Document Released: 09/24/2005 Document Revised: 02/08/2014 Document Reviewed: 10/11/2011 °ExitCare® Patient Information ©2015 [employer]. This information is not intended to replace advice given to you by your health care provider. Make sure you discuss any questions you have with your health care provider. ° °

## 2015-05-14 ENCOUNTER — Encounter (HOSPITAL_COMMUNITY): Payer: Self-pay | Admitting: Emergency Medicine

## 2015-05-14 ENCOUNTER — Emergency Department (HOSPITAL_COMMUNITY)
Admission: EM | Admit: 2015-05-14 | Discharge: 2015-05-14 | Disposition: A | Discharge status: Home / Self Care | Payer: Medicaid Other | Attending: Emergency Medicine | Admitting: Emergency Medicine

## 2015-05-14 DIAGNOSIS — Z79899 Other long term (current) drug therapy: Secondary | ICD-10-CM | POA: Diagnosis not present

## 2015-05-14 DIAGNOSIS — L259 Unspecified contact dermatitis, unspecified cause: Secondary | ICD-10-CM

## 2015-05-14 DIAGNOSIS — Z792 Long term (current) use of antibiotics: Secondary | ICD-10-CM | POA: Insufficient documentation

## 2015-05-14 DIAGNOSIS — J029 Acute pharyngitis, unspecified: Secondary | ICD-10-CM | POA: Insufficient documentation

## 2015-05-14 DIAGNOSIS — R238 Other skin changes: Secondary | ICD-10-CM | POA: Diagnosis present

## 2015-05-14 MED ORDER — PREDNISONE 10 MG PO TABS: 20.0000 mg | ORAL_TABLET | Freq: Every day | ORAL | Status: DC

## 2015-05-14 MED ORDER — PREDNISONE 20 MG PO TABS
40.0000 mg | ORAL_TABLET | Freq: Once | ORAL | Status: AC
  Administered 2015-05-14: 40 mg via ORAL
  Filled 2015-05-14: qty 2

## 2015-05-14 NOTE — ED Provider Notes (Signed)
CSN: [id number]     Arrival date & time 05/14/15  0225 History   This chart was scribed for Raeford Razor, MD by Arlan Organ, ED Scribe. This patient was seen in room WA17/WA17 and the patient's care was started 2:34 AM.   Chief Complaint  Patient presents with  . Allergic Reaction   The history is provided by the patient. No language interpreter was used.    HPI Comments: Kylie Huerta is a 24 y.o. female without any pertinent past medical history who presents to the Emergency Department here for an allergic reaction this morning. Pt states she used hair dye approximately 2 days ago and has noted worsening symptoms since time of initial use. She now c/o ongoing swelling to hairline, a tightness sensation to her scalp, and "burning" to the throat. OTC Ibuprofen and Benadryl attempted prior to arrival without any improvement for symptoms. She denies any significant drainage from scalp. No recent fever, chills, nausea, vomiting, diarrhea, or abdominal pain. She denies any shortness of breath or intraoral swelling. No known allergies to medications.  History reviewed. No pertinent past medical history. History reviewed. No pertinent past surgical history. No family history on file. History  Substance Use Topics  . Smoking status: Never Smoker   . Smokeless tobacco: Not on file  . Alcohol Use: No   OB History    No data available     Review of Systems  Constitutional: Negative for fever and chills.  HENT: Positive for sore throat. Negative for trouble swallowing.   Respiratory: Negative for shortness of breath.   Gastrointestinal: Negative for nausea, vomiting, abdominal pain and diarrhea.  Neurological: Negative for headaches.  Psychiatric/Behavioral: Negative for confusion.  All other systems reviewed and are negative.     Allergies  Almond oil; Other; and Kiwi extract  Home Medications   Prior to Admission medications   Medication Sig Start Date End Date Taking?  Authorizing Provider  amoxicillin-clavulanate (AUGMENTIN) 875-125 MG per tablet Take 1 tablet by mouth 2 (two) times daily. 10/19/14   Earley Favor, NP  diphenhydrAMINE (BENADRYL) 25 MG tablet Take 1 tablet (25 mg total) by mouth every 6 (six) hours as needed for itching (Rash). 04/28/14   Junious Silk, PA-C  famotidine (PEPCID) 40 MG tablet Take 0.5 tablets (20 mg total) by mouth daily. OTC Patient not taking: Reported on 10/19/2014 04/28/14   Junious Silk, PA-C  HYDROcodone-acetaminophen (NORCO/VICODIN) 5-325 MG per tablet Take 1 tablet by mouth every 6 (six) hours as needed for moderate pain. 10/19/14   Earley Favor, NP  ibuprofen (ADVIL,MOTRIN) 800 MG tablet Take 800 mg by mouth every 6 (six) hours as needed for mild pain.    Historical Provider, MD  ofloxacin (FLOXIN) 0.3 % otic solution Place 10 drops into the right ear 2 (two) times daily. 10/21/14   Courtney Forcucci, PA-C  oxyCODONE-acetaminophen (PERCOCET) 5-325 MG per tablet Take 2 tablets by mouth every 4 (four) hours as needed. 10/21/14   Courtney Forcucci, PA-C  predniSONE (DELTASONE) 20 MG tablet Take 2 tablets (40 mg total) by mouth daily. Patient not taking: Reported on 10/19/2014 04/28/14   Junious Silk, PA-C   Triage Vitals: BP 163/106 mmHg  Pulse 96  Temp(Src) 99.1 F (37.3 C) (Oral)  Resp 20  SpO2 100%  LMP 04/13/2015   Physical Exam  Constitutional: She is oriented to person, place, and time. She appears well-developed and well-nourished.  HENT:  Head: Normocephalic.  Mouth/Throat: Oropharynx is clear and moist.  Eyes: EOM are  normal.  Neck: Normal range of motion.  Pulmonary/Chest: Effort normal and breath sounds normal. No stridor.  Lungs are clear  Abdominal: She exhibits no distension.  Musculoskeletal: Normal range of motion.  Neurological: She is alert and oriented to person, place, and time.  Skin:  Small vesicles noted to scalp and around hairline   Psychiatric: She has a normal mood and affect.  Nursing  note and vitals reviewed.   ED Course  Procedures (including critical care time)  DIAGNOSTIC STUDIES: Oxygen Saturation is 100% on RA, Normal by my interpretation.    COORDINATION OF CARE: 2:39 AM- Will give Prednisone. Discussed treatment plan with pt at bedside and pt agreed to plan.     Labs Review Labs Reviewed - No data to display  Imaging Review No results found.   EKG Interpretation None      MDM   Final diagnoses:  Contact dermatitis    24 year old female with contact dermatitis likely from hair dye. Plan symptomatic treatment. Return precautions discussed.  I personally preformed the services scribed in my presence. The recorded information has been reviewed is accurate. Raeford Razor, MD.   Raeford Razor, MD 05/22/15 [phone]

## 2015-05-14 NOTE — ED Notes (Signed)
Pt from home c/o head itching and swelling from using a new hair dye 2 days ago. Pt reports that she had benadryl and motrin at 2300. NAD. Lung sounds clear.

## 2015-05-14 NOTE — Discharge Instructions (Signed)

## 2016-01-01 ENCOUNTER — Emergency Department (HOSPITAL_COMMUNITY)
Admission: EM | Admit: 2016-01-01 | Discharge: 2016-01-01 | Disposition: A | Discharge status: Home / Self Care | Payer: Medicaid Other | Attending: Emergency Medicine | Admitting: Emergency Medicine

## 2016-01-01 ENCOUNTER — Encounter (HOSPITAL_COMMUNITY): Payer: Self-pay | Admitting: Emergency Medicine

## 2016-01-01 DIAGNOSIS — R197 Diarrhea, unspecified: Secondary | ICD-10-CM | POA: Insufficient documentation

## 2016-01-01 DIAGNOSIS — Z3202 Encounter for pregnancy test, result negative: Secondary | ICD-10-CM | POA: Diagnosis not present

## 2016-01-01 DIAGNOSIS — R1010 Upper abdominal pain, unspecified: Secondary | ICD-10-CM | POA: Insufficient documentation

## 2016-01-01 DIAGNOSIS — R1013 Epigastric pain: Secondary | ICD-10-CM | POA: Insufficient documentation

## 2016-01-01 DIAGNOSIS — R1110 Vomiting, unspecified: Secondary | ICD-10-CM | POA: Diagnosis present

## 2016-01-01 LAB — COMPREHENSIVE METABOLIC PANEL
ALT: 49 U/L (ref 14–54)
ANION GAP: 9 (ref 5–15)
AST: 48 U/L — ABNORMAL HIGH (ref 15–41)
Albumin: 3.5 g/dL (ref 3.5–5.0)
Alkaline Phosphatase: 69 U/L (ref 38–126)
BILIRUBIN TOTAL: 0.2 mg/dL — AB (ref 0.3–1.2)
BUN: 6 mg/dL (ref 6–20)
CO2: 26 mmol/L (ref 22–32)
Calcium: 9.3 mg/dL (ref 8.9–10.3)
Chloride: 106 mmol/L (ref 101–111)
Creatinine, Ser: 0.83 mg/dL (ref 0.44–1.00)
GFR calc Af Amer: >60 mL/min (ref >60)
GFR calc non Af Amer: >60 mL/min (ref >60)
Glucose, Bld: 90 mg/dL (ref 65–99)
POTASSIUM: 3.5 mmol/L (ref 3.5–5.1)
Sodium: 141 mmol/L (ref 135–145)
TOTAL PROTEIN: 7.4 g/dL (ref 6.5–8.1)

## 2016-01-01 LAB — CBC
HCT: 38.5 % (ref 36.0–46.0)
HEMOGLOBIN: 12.3 g/dL (ref 12.0–15.0)
MCH: 26.8 pg (ref 26.0–34.0)
MCHC: 31.9 g/dL (ref 30.0–36.0)
MCV: 83.9 fL (ref 78.0–100.0)
Platelets: 280 10*3/uL (ref 150–400)
RBC: 4.59 MIL/uL (ref 3.87–5.11)
RDW: 13.1 % (ref 11.5–15.5)
WBC: 5.1 10*3/uL (ref 4.0–10.5)

## 2016-01-01 LAB — URINALYSIS, ROUTINE W REFLEX MICROSCOPIC
Bilirubin Urine: NEGATIVE
Glucose, UA: NEGATIVE mg/dL
Hgb urine dipstick: NEGATIVE
KETONES UR: NEGATIVE mg/dL
LEUKOCYTES UA: NEGATIVE
NITRITE: NEGATIVE
Protein, ur: NEGATIVE mg/dL
Specific Gravity, Urine: 1.027 (ref 1.005–1.030)
pH: 6 (ref 5.0–8.0)

## 2016-01-01 LAB — LIPASE, BLOOD: LIPASE: 23 U/L (ref 11–51)

## 2016-01-01 LAB — POC URINE PREG, ED: Preg Test, Ur: NEGATIVE

## 2016-01-01 MED ORDER — ONDANSETRON HCL 4 MG/2ML IJ SOLN
4.0000 mg | Freq: Once | INTRAMUSCULAR | Status: AC
  Administered 2016-01-01: 4 mg via INTRAVENOUS
  Filled 2016-01-01: qty 2

## 2016-01-01 MED ORDER — SODIUM CHLORIDE 0.9 % IV BOLUS (SEPSIS)
1000.0000 mL | Freq: Once | INTRAVENOUS | Status: AC
  Administered 2016-01-01: 1000 mL via INTRAVENOUS

## 2016-01-01 NOTE — ED Provider Notes (Signed)
CSN: [id number]     Arrival date & time 01/01/16  0249 History   First MD Initiated Contact with Patient 01/01/16 [phone]     Chief Complaint  Patient presents with  . Emesis  . Abdominal Pain  . Diarrhea     (Consider location/radiation/quality/duration/timing/severity/associated sxs/prior Treatment) HPI Comments: 25 year old female who presents with vomiting and diarrhea. 3 days ago, the patient began having vomiting, crampy upper abdominal pain, and explosive diarrhea. She denies any blood in her stool. The vomiting stopped the following day but the diarrhea has continued as well as the upper abdominal cramping. She denies any urinary symptoms, vaginal discharge, fevers, or cough/cold symptoms. No sick contacts or recent travel.  Patient is a 25 y.o. female presenting with vomiting, abdominal pain, and diarrhea. The history is provided by the patient.  Emesis Associated symptoms: abdominal pain and diarrhea   Abdominal Pain Associated symptoms: diarrhea and vomiting   Diarrhea Associated symptoms: abdominal pain and vomiting     History reviewed. No pertinent past medical history. History reviewed. No pertinent past surgical history. No family history on file. Social History  Substance Use Topics  . Smoking status: Never Smoker   . Smokeless tobacco: None  . Alcohol Use: No   OB History    No data available     Review of Systems  Gastrointestinal: Positive for vomiting, abdominal pain and diarrhea.   10 Systems reviewed and are negative for acute change except as noted in the HPI.    Allergies  Almond oil; Other; and Kiwi extract  Home Medications   Prior to Admission medications   Medication Sig Start Date End Date Taking? Authorizing Provider  amoxicillin-clavulanate (AUGMENTIN) 875-125 MG per tablet Take 1 tablet by mouth 2 (two) times daily. Patient not taking: Reported on 01/01/2016 10/19/14   Earley Favor, NP  diphenhydrAMINE (BENADRYL) 25 MG tablet Take 1  tablet (25 mg total) by mouth every 6 (six) hours as needed for itching (Rash). Patient not taking: Reported on 01/01/2016 04/28/14   Junious Silk, PA-C  famotidine (PEPCID) 40 MG tablet Take 0.5 tablets (20 mg total) by mouth daily. OTC Patient not taking: Reported on 10/19/2014 04/28/14   Junious Silk, PA-C  HYDROcodone-acetaminophen (NORCO/VICODIN) 5-325 MG per tablet Take 1 tablet by mouth every 6 (six) hours as needed for moderate pain. Patient not taking: Reported on 01/01/2016 10/19/14   Earley Favor, NP  ofloxacin (FLOXIN) 0.3 % otic solution Place 10 drops into the right ear 2 (two) times daily. Patient not taking: Reported on 01/01/2016 10/21/14   Terri Piedra, PA-C  oxyCODONE-acetaminophen (PERCOCET) 5-325 MG per tablet Take 2 tablets by mouth every 4 (four) hours as needed. Patient not taking: Reported on 01/01/2016 10/21/14   Terri Piedra, PA-C  predniSONE (DELTASONE) 10 MG tablet Take 2 tablets (20 mg total) by mouth daily with breakfast.  daily for 2 days, then  2 days, 20 mg 2 days, 10 mg 2 days, 5 mg 2 days Patient not taking: Reported on 01/01/2016 05/14/15   Raeford Razor, MD  predniSONE (DELTASONE) 20 MG tablet Take 2 tablets (40 mg total) by mouth daily. Patient not taking: Reported on 10/19/2014 04/28/14   Junious Silk, PA-C   BP 142/94 mmHg  Pulse 95  Temp(Src) 98.2 F (36.8 C) (Oral)  Resp 18  SpO2 97%  LMP 12/12/2015 (Approximate) Physical Exam  Constitutional: She is oriented to person, place, and time. She appears well-developed and well-nourished. No distress.  HENT:  Head: Normocephalic and atraumatic.  Mouth/Throat: Oropharynx is clear and moist.  Moist mucous membranes  Eyes: Conjunctivae are normal. Pupils are equal, round, and reactive to light.  Neck: Neck supple.  Cardiovascular: Normal rate, regular rhythm and normal heart sounds.   No murmur heard. Pulmonary/Chest: Effort normal and breath sounds normal.  Abdominal: Soft. Bowel sounds are  normal. She exhibits no distension. There is tenderness.  Mild midepigastric tenderness, no rebound or guarding  Musculoskeletal: She exhibits no edema.  Neurological: She is alert and oriented to person, place, and time.  Fluent speech  Skin: Skin is warm and dry.  Psychiatric: She has a normal mood and affect. Judgment normal.  Nursing note and vitals reviewed.   ED Course  Procedures (including critical care time) Labs Review Labs Reviewed  COMPREHENSIVE METABOLIC PANEL - Abnormal; Notable for the following:    AST 48 (*)    Total Bilirubin 0.2 (*)    All other components within normal limits  URINALYSIS, ROUTINE W REFLEX MICROSCOPIC (NOT AT [facility]) - Abnormal; Notable for the following:    APPearance HAZY (*)    All other components within normal limits  LIPASE, BLOOD  CBC  POC URINE PREG, ED   I have personally reviewed and evaluated these  lab results as part of my medical decision-making.   EKG Interpretation None     Medications  sodium chloride 0.9 % bolus 1,000 mL (1,000 mLs Intravenous New Bag/Given 01/01/16 0729)  ondansetron (ZOFRAN) injection 4 mg (4 mg Intravenous Given 01/01/16 0729)    MDM   Final diagnoses:  None   Patient with 3 days of diarrhea initially associated with vomiting; vomiting as stopped but diarrhea and cramping abdominal pain has continued. She was well-appearing at presentation with reassuring vital signs. Mild midepigastric tenderness but no lower abdominal pain. I reviewed lab work listed above which was unremarkable and showed no evidence of dehydration, normal WBC count. Gave the patient Zofran and IV fluid bolus. Discussed supportive care including continued hydration, lactobacillus, and avoidance of spicy, greasy, or difficult to digest food. Instructed to follow-up with if diarrhea persists more than one week. Patient voiced understanding of return precautions and was discharged in satisfactory condition.   Laurence Spatesachel Morgan Carleta Woodrow,  MD 01/01/16 [phone]

## 2016-01-01 NOTE — ED Notes (Signed)
Patient with abdominal pain and vomiting for the last two days.  Patient states she is unable to keep anything down at this time.  She has been trying to manage herself at home, but now with explosive diarrhea.

## 2016-02-29 ENCOUNTER — Encounter: Payer: Self-pay | Admitting: Obstetrics and Gynecology

## 2016-02-29 ENCOUNTER — Other Ambulatory Visit (HOSPITAL_COMMUNITY)
Admission: RE | Admit: 2016-02-29 | Discharge: 2016-02-29 | Disposition: A | Discharge status: Home / Self Care | Payer: Medicaid Other | Source: Ambulatory Visit | Attending: Obstetrics and Gynecology | Admitting: Obstetrics and Gynecology

## 2016-02-29 ENCOUNTER — Ambulatory Visit (INDEPENDENT_AMBULATORY_CARE_PROVIDER_SITE_OTHER): Payer: Medicaid Other | Admitting: Obstetrics and Gynecology

## 2016-02-29 VITALS — BP 140/89 | HR 86 | Ht 71.0 in | Wt 313.0 lb

## 2016-02-29 DIAGNOSIS — Z01419 Encounter for gynecological examination (general) (routine) without abnormal findings: Secondary | ICD-10-CM | POA: Diagnosis present

## 2016-02-29 DIAGNOSIS — Z1151 Encounter for screening for human papillomavirus (HPV): Secondary | ICD-10-CM

## 2016-02-29 DIAGNOSIS — Z124 Encounter for screening for malignant neoplasm of cervix: Secondary | ICD-10-CM

## 2016-02-29 DIAGNOSIS — Z113 Encounter for screening for infections with a predominantly sexual mode of transmission: Secondary | ICD-10-CM | POA: Insufficient documentation

## 2016-02-29 LAB — HIV ANTIBODY (ROUTINE TESTING W REFLEX): HIV: NONREACTIVE

## 2016-02-29 NOTE — Progress Notes (Signed)
  Subjective:     Kylie Huerta is a 25 y.o. female [phone] with BMI 6143 who is here for a comprehensive physical exam. The patient reports no problems. Patient is sexually active and wanting to conceive. Patient reports monthly cycles of 5 days. Patient denies abnormal discharge or pelvic pain. Patient desires to be tested for STD today  History reviewed. No pertinent past medical history. Past Surgical History  Procedure Laterality Date  . Cesarean section     History reviewed. No pertinent family history.   Social History   Social History  . Marital Status: Single    Spouse Name: N/A  . Number of Children: N/A  . Years of Education: N/A   Occupational History  . Not on file.   Social History Main Topics  . Smoking status: Never Smoker   . Smokeless tobacco: Not on file  . Alcohol Use: No  . Drug Use: No  . Sexual Activity: Yes    Birth Control/ Protection: None   Other Topics Concern  . Not on file   Social History Narrative   Health Maintenance  Topic Date Due  . HIV Screening  06/16/2006  . TETANUS/TDAP  06/16/2010  . PAP SMEAR  06/16/2012  . INFLUENZA VACCINE  05/08/2016       Review of Systems Pertinent items are noted in HPI.   Objective:  Blood pressure 140/89, pulse 86, height 5\' 11"  (1.803 m), weight 313 lb (141.976 kg), last menstrual period 02/12/2016.     GENERAL: Well-developed, well-nourished female in no acute distress. Obese HEENT: Normocephalic, atraumatic. Sclerae anicteric.  NECK: Supple. Normal thyroid.  LUNGS: Clear to auscultation bilaterally.  HEART: Regular rate and rhythm. BREASTS: Symmetric in size. No palpable masses or lymphadenopathy, skin changes, or nipple drainage. ABDOMEN: Soft, nontender, nondistended. Obese PELVIC: Normal external female genitalia. Vagina is pink and rugated.  Normal discharge. Normal appearing cervix. Uterus is normal in size. No adnexal mass or tenderness. EXTREMITIES: No cyanosis, clubbing, or edema,  2+ distal pulses.    Assessment:    Healthy female exam.      Plan:    pap smear collected Cultures collected STI collected Patient will be contacted with any abnormal results Advised to take prenatal vitamins Discussed healthy eating habits and weight loss to optimize a healthy pregnancy See After Visit Summary for Counseling Recommendations

## 2016-03-01 LAB — GC/CHLAMYDIA PROBE AMP (~~LOC~~) NOT AT ARMC
CHLAMYDIA, DNA PROBE: NEGATIVE
NEISSERIA GONORRHEA: NEGATIVE

## 2016-03-01 LAB — HEPATITIS B SURFACE ANTIGEN: HEP B S AG: NEGATIVE

## 2016-03-01 LAB — HEPATITIS C ANTIBODY: HCV Ab: NEGATIVE

## 2016-03-01 LAB — CYTOLOGY - PAP

## 2016-03-02 LAB — RPR

## 2016-04-27 ENCOUNTER — Ambulatory Visit (INDEPENDENT_AMBULATORY_CARE_PROVIDER_SITE_OTHER): Payer: Medicaid Other | Admitting: Advanced Practice Midwife

## 2016-04-27 ENCOUNTER — Other Ambulatory Visit (HOSPITAL_COMMUNITY)
Admission: RE | Admit: 2016-04-27 | Discharge: 2016-04-27 | Disposition: A | Discharge status: Home / Self Care | Payer: Medicaid Other | Source: Ambulatory Visit | Attending: Advanced Practice Midwife | Admitting: Advanced Practice Midwife

## 2016-04-27 ENCOUNTER — Encounter: Payer: Self-pay | Admitting: Advanced Practice Midwife

## 2016-04-27 VITALS — BP 147/98 | HR 88 | Ht 68.0 in | Wt 307.3 lb

## 2016-04-27 DIAGNOSIS — Z202 Contact with and (suspected) exposure to infections with a predominantly sexual mode of transmission: Secondary | ICD-10-CM

## 2016-04-27 DIAGNOSIS — N940 Mittelschmerz: Secondary | ICD-10-CM | POA: Diagnosis not present

## 2016-04-27 DIAGNOSIS — N898 Other specified noninflammatory disorders of vagina: Secondary | ICD-10-CM

## 2016-04-27 DIAGNOSIS — Z113 Encounter for screening for infections with a predominantly sexual mode of transmission: Secondary | ICD-10-CM

## 2016-04-27 LAB — POCT URINALYSIS DIP (DEVICE)
Bilirubin Urine: NEGATIVE
Glucose, UA: NEGATIVE mg/dL
HGB URINE DIPSTICK: NEGATIVE
Ketones, ur: NEGATIVE mg/dL
LEUKOCYTES UA: NEGATIVE
NITRITE: NEGATIVE
Protein, ur: NEGATIVE mg/dL
Specific Gravity, Urine: >=1.03 (ref 1.005–1.030)
Urobilinogen, UA: 0.2 mg/dL (ref 0.0–1.0)
pH: 6.5 (ref 5.0–8.0)

## 2016-04-27 NOTE — Progress Notes (Signed)
Here for c/o vaginal discharge, possible uti, wants std testing.

## 2016-04-27 NOTE — Progress Notes (Signed)
Subjective:     Patient ID: Kylie Huerta, female   DOB: 12/11/1990, 25 y.o.   MRN: [id number]  HPI : Here for vaginal discharge, irritation, low abd cramping x 2 days and requesting STD testing. Female partners only.   Review of Systems  Neg for fever chills, VB, genital lesions.      Objective:   Physical Exam   BP 147/98 mmHg  Pulse 88  Ht 5\' 8"  (1.727 m)  Wt 307 lb 4.8 oz (139.39 kg)  BMI 46.74 kg/m2  LMP 04/13/2016 Pelvic Exam:    Perineum: No Hemorrhoids, Normal Perineum   Vulva: normal   Vagina:  normal mucosa, moderate clear, thick, odorless discharge, no palpable nodules   pH: Not done   Cervix: no bleeding, no cervical motion tenderness and no lesions   Adnexa: normal adnexa and no mass, fullness, tenderness   Skin: normal coloration and turgor, no rashes    Neurologic: negative   Cardiovascular: regular rate   Respiratory:  appears well, vitals normal, no respiratory distress, acyanotic, normal RR.   Abdomen: soft, non-tender; bowel sounds normal; no masses,  no organomegaly   Urinary: urethral meatus normal      Assessment:     1. Possible exposure to STD  - GC/Chlamydia probe amp ([place] - Wet prep, genital - Hepatitis B surface antigen - RPR - HIV antibody  2. Vaginal discharge, cramping likely 2/2 ovulation  Kylie Huerta, [place] 04/27/2016 9:51 AM

## 2016-04-27 NOTE — Patient Instructions (Signed)

## 2016-04-28 LAB — WET PREP, GENITAL
CLUE CELLS WET PREP: NONE SEEN
Trich, Wet Prep: NONE SEEN
WBC, Wet Prep HPF POC: NONE SEEN
Yeast Wet Prep HPF POC: NONE SEEN

## 2016-04-28 LAB — HIV ANTIBODY (ROUTINE TESTING W REFLEX): HIV: NONREACTIVE

## 2016-04-28 LAB — HEPATITIS B SURFACE ANTIGEN: Hepatitis B Surface Ag: NEGATIVE

## 2016-04-28 LAB — RPR

## 2016-04-30 LAB — GC/CHLAMYDIA PROBE AMP (~~LOC~~) NOT AT ARMC
CHLAMYDIA, DNA PROBE: NEGATIVE
NEISSERIA GONORRHEA: NEGATIVE

## 2016-06-14 ENCOUNTER — Encounter (HOSPITAL_COMMUNITY): Payer: Self-pay

## 2016-06-14 ENCOUNTER — Emergency Department (HOSPITAL_COMMUNITY)
Admission: EM | Admit: 2016-06-14 | Discharge: 2016-06-14 | Disposition: A | Discharge status: Home / Self Care | Payer: Medicaid Other | Attending: Emergency Medicine | Admitting: Emergency Medicine

## 2016-06-14 ENCOUNTER — Emergency Department (HOSPITAL_COMMUNITY): Payer: Medicaid Other

## 2016-06-14 DIAGNOSIS — M25571 Pain in right ankle and joints of right foot: Secondary | ICD-10-CM | POA: Diagnosis present

## 2016-06-14 MED ORDER — KETOROLAC TROMETHAMINE 60 MG/2ML IM SOLN
60.0000 mg | Freq: Once | INTRAMUSCULAR | Status: AC
  Administered 2016-06-14: 60 mg via INTRAMUSCULAR
  Filled 2016-06-14: qty 2

## 2016-06-14 MED ORDER — NAPROXEN 500 MG PO TABS: 500.0000 mg | ORAL_TABLET | Freq: Two times a day (BID) | ORAL | 0 refills | Status: DC

## 2016-06-14 NOTE — ED Notes (Signed)
Pt ambulatory and independent at discharge.  Verbalized understanding of discharge instructions 

## 2016-06-14 NOTE — ED Provider Notes (Signed)
[facility]-EMERGENCY DEPT Provider Note   CSN: [id number] Arrival date & time: 06/14/16  0430     History   Chief Complaint Chief Complaint  Patient presents with  . Ankle Pain    HPI Kylie Huerta is a 25 y.o. female.  The history is provided by the patient.  Ankle Pain   The incident occurred 1 to 2 hours ago. The incident occurred at home. Injury mechanism: missed the edge of the porch 3 inches up. The pain is present in the right ankle. The quality of the pain is described as aching. The pain is moderate. The pain has been constant since onset. Pertinent negatives include no numbness. She reports no foreign bodies present. Nothing aggravates the symptoms. She has tried nothing for the symptoms. The treatment provided no relief.    History reviewed. No pertinent past medical history.  There are no active problems to display for this patient.   Past Surgical History:  Procedure Laterality Date  . CESAREAN SECTION      OB History    Gravida Para Term Preterm AB Living   1 1 1     1    SAB TAB Ectopic Multiple Live Births           1       Home Medications    Prior to Admission medications   Not on File    Family History Family History  Problem Relation Age of Onset  . Kidney failure Father   . Other Mother     brain tumor  . Kidney disease Mother     Social History Social History  Substance Use Topics  . Smoking status: Never Smoker  . Smokeless tobacco: Never Used  . Alcohol use No     Allergies   Almond oil; Other; and Kiwi extract   Review of Systems Review of Systems  Eyes: Negative for photophobia.  Musculoskeletal: Positive for arthralgias.  Neurological: Negative for numbness.  All other systems reviewed and are negative.    Physical Exam Updated Vital Signs BP (!) 165/122   Pulse 95   Temp 98.1 F (36.7 C) (Oral)   Resp 20   Ht 5\' 8"  (1.727 m)   Wt 287 lb (130.2 kg)   LMP 06/13/2016   SpO2 99%   BMI 43.64 kg/m   Physical  Exam  Constitutional: She is oriented to person, place, and time. She appears well-developed and well-nourished. No distress.  HENT:  Head: Normocephalic and atraumatic.  Nose: Nose normal.  Eyes: Pupils are equal, round, and reactive to light.  Neck: Normal range of motion. Neck supple.  Cardiovascular: Normal rate, regular rhythm and intact distal pulses.   Pulmonary/Chest: Effort normal and breath sounds normal. No respiratory distress.  Abdominal: Soft. Bowel sounds are normal. She exhibits no mass. There is no tenderness. There is no guarding.  Musculoskeletal: Normal range of motion.       Right ankle: She exhibits normal range of motion, no swelling, no ecchymosis, no deformity, no laceration and normal pulse. Tenderness. AITFL tenderness found. No lateral malleolus, no medial malleolus, no CF ligament, no posterior TFL, no head of 5th metatarsal and no proximal fibula tenderness found. Achilles tendon normal.       Right lower leg: Normal.       Right foot: Normal.  Neurological: She is alert and oriented to person, place, and time. She has normal reflexes.  Skin: Skin is warm and dry. Capillary refill takes less than 2 seconds.  ED Treatments / Results  Labs (all labs ordered are listed, but only abnormal results are displayed) Labs Reviewed - No data to display  EKG  EKG Interpretation None       Radiology No results found.  Procedures Procedures (including critical care time)  Medications Ordered in ED Medications - No data to display   Initial Impression / Assessment and Plan / ED Course  I have reviewed the triage vital signs and the nursing notes.  Pertinent labs & imaging results that were available during my care of the patient were reviewed by me and considered in my medical decision making (see chart for details).  Vitals:   06/14/16 0440 06/14/16 0442  BP:  (!) 165/122  Pulse: 95   Resp: 20   Temp: 98.1 F (36.7 C)    Results for orders  placed or performed in visit on 04/27/16  Wet prep, genital  Result Value Ref Range   Yeast Wet Prep HPF POC NONE SEEN NONE SEEN   Trich, Wet Prep NONE SEEN NONE SEEN   Clue Cells Wet Prep HPF POC NONE SEEN NONE SEEN   WBC, Wet Prep HPF POC NONE SEEN NONE SEEN  Hepatitis B surface antigen  Result Value Ref Range   Hepatitis B Surface Ag NEGATIVE NEGATIVE  RPR  Result Value Ref Range   RPR Ser Ql NON REAC NON REAC  HIV antibody  Result Value Ref Range   HIV 1&2 Ab, 4th Generation NONREACTIVE NONREACTIVE  POCT urinalysis dip (device)  Result Value Ref Range   Glucose, UA NEGATIVE NEGATIVE mg/dL   Bilirubin Urine NEGATIVE NEGATIVE   Ketones, ur NEGATIVE NEGATIVE mg/dL   Specific Gravity, Urine >=1.030 1.005 - 1.030   Hgb urine dipstick NEGATIVE NEGATIVE   pH 6.5 5.0 - 8.0   Protein, ur NEGATIVE NEGATIVE mg/dL   Urobilinogen, UA 0.2 0.0 - 1.0 mg/dL   Nitrite NEGATIVE NEGATIVE   Leukocytes, UA NEGATIVE NEGATIVE  GC/Chlamydia probe amp ([place])not at [facility]  Result Value Ref Range   Chlamydia Negative    Neisseria gonorrhea Negative    Dg Ankle Complete Right  Result Date: 06/14/2016 CLINICAL DATA:  Status post fall from porch, with right ankle pain. Initial encounter. EXAM: RIGHT ANKLE - COMPLETE 3+ VIEW COMPARISON:  None. FINDINGS: There is no evidence of fracture or dislocation. The ankle mortise is intact; the interosseous space is within normal limits. No talar tilt or subluxation is seen. The joint spaces are preserved. No significant soft tissue abnormalities are seen. IMPRESSION: No evidence of fracture or dislocation. Electronically Signed   By: Roanna Raider M.D.   On: 06/14/2016 05:14     Final Clinical Impressions(s) / ED Diagnoses   Final diagnoses:  None    New Prescriptions New Prescriptions   No medications on file  All questions answered to patient's satisfaction. Based on history and exam patient has been appropriately medically screened and emergency  conditions excluded. Patient is stable for discharge at this time. Follow up with your PMD for recheck in 2 days and strict return precautions given.  NSAIDs and ice Elevation and ASO   Jewell Ryans, MD 06/14/16 [phone]

## 2016-06-14 NOTE — ED Triage Notes (Signed)
Pt fell off the porch this morning and heard something pop in her right ankle

## 2016-06-14 NOTE — ED Notes (Signed)
Ice applied to injured ankle

## 2016-07-10 ENCOUNTER — Ambulatory Visit: Payer: Medicaid Other

## 2016-09-23 ENCOUNTER — Inpatient Hospital Stay (HOSPITAL_COMMUNITY)
Admission: AD | Admit: 2016-09-23 | Discharge: 2016-09-23 | Disposition: A | Discharge status: Home / Self Care | Payer: Medicaid Other | Source: Ambulatory Visit | Attending: Obstetrics and Gynecology | Admitting: Obstetrics and Gynecology

## 2016-09-23 ENCOUNTER — Encounter (HOSPITAL_COMMUNITY): Payer: Self-pay | Admitting: *Deleted

## 2016-09-23 DIAGNOSIS — Z3202 Encounter for pregnancy test, result negative: Secondary | ICD-10-CM | POA: Insufficient documentation

## 2016-09-23 DIAGNOSIS — I10 Essential (primary) hypertension: Secondary | ICD-10-CM

## 2016-09-23 DIAGNOSIS — N898 Other specified noninflammatory disorders of vagina: Secondary | ICD-10-CM

## 2016-09-23 LAB — WET PREP, GENITAL
Clue Cells Wet Prep HPF POC: NONE SEEN
SPERM: NONE SEEN
Trich, Wet Prep: NONE SEEN
YEAST WET PREP: NONE SEEN

## 2016-09-23 LAB — URINALYSIS, ROUTINE W REFLEX MICROSCOPIC
BILIRUBIN URINE: NEGATIVE
GLUCOSE, UA: NEGATIVE mg/dL
Hgb urine dipstick: NEGATIVE
KETONES UR: 5 mg/dL — AB
Leukocytes, UA: NEGATIVE
NITRITE: NEGATIVE
PH: 6 (ref 5.0–8.0)
Protein, ur: NEGATIVE mg/dL
SPECIFIC GRAVITY, URINE: 1.021 (ref 1.005–1.030)

## 2016-09-23 LAB — POCT PREGNANCY, URINE: Preg Test, Ur: NEGATIVE

## 2016-09-23 NOTE — MAU Provider Note (Signed)
History   G1P1001 not pregnant in with c/o vag discharge and questionable preg, had weak pos at home.  CSN: [id number]  Arrival date & time 09/23/16  1654   First Provider Initiated Contact with Patient 09/23/16 1810      Chief Complaint  Patient presents with  . Possible Pregnancy  . Vaginal Discharge    HPI  History reviewed. No pertinent past medical history.  Past Surgical History:  Procedure Laterality Date  . CESAREAN SECTION      Family History  Problem Relation Age of Onset  . Kidney failure Father   . Other Mother     brain tumor  . Kidney disease Mother     Social History  Substance Use Topics  . Smoking status: Never Smoker  . Smokeless tobacco: Never Used  . Alcohol use No    OB History    Gravida Para Term Preterm AB Living   1 1 1     1    SAB TAB Ectopic Multiple Live Births           1      Review of Systems  Constitutional: Negative.   HENT: Negative.   Eyes: Negative.   Respiratory: Negative.   Cardiovascular: Negative.   Gastrointestinal: Negative.   Endocrine: Negative.   Genitourinary: Positive for vaginal discharge.  Musculoskeletal: Negative.   Skin: Negative.   Allergic/Immunologic: Negative.   Neurological: Negative.   Hematological: Negative.   Psychiatric/Behavioral: Negative.     Allergies  Almond oil; Other; and Kiwi extract  Home Medications    BP 136/95 (BP Location: Right Arm)   Pulse 78   Temp 98.9 F (37.2 C) (Oral)   Resp 18   Wt (!) 303 lb 9.6 oz (137.7 kg)   LMP 08/07/2016   BMI 46.16 kg/m   Physical Exam  Constitutional: She is oriented to person, place, and time. She appears well-developed and well-nourished.  HENT:  Head: Normocephalic.  Eyes: Pupils are equal, round, and reactive to light.  Neck: Normal range of motion.  Cardiovascular: Normal rate, regular rhythm, normal heart sounds and intact distal pulses.   Pulmonary/Chest: Effort normal and breath sounds normal.  Abdominal: Soft.  Bowel sounds are normal.  Genitourinary: Vagina normal and uterus normal.  Musculoskeletal: Normal range of motion.  Neurological: She is alert and oriented to person, place, and time. She has normal reflexes.  Skin: Skin is warm and dry.  Psychiatric: She has a normal mood and affect. Her behavior is normal. Judgment and thought content normal.    MAU Course  Procedures (including critical care time)  Labs Reviewed  URINALYSIS, ROUTINE W REFLEX MICROSCOPIC - Abnormal; Notable for the following:       Result Value   APPearance HAZY (*)    Ketones, ur 5 (*)    All other components within normal limits  WET PREP, GENITAL  POCT PREGNANCY, URINE  GC/CHLAMYDIA PROBE AMP ([place]) NOT [facility]   No results found.   1. Uncontrolled hypertension   2. Vaginal discharge       MDM  Wet prep , GC and chla done, lengthy discussion on importance of treating and controlling her hypertension. Pt is to seek primary care provider for her hypertension.

## 2016-09-23 NOTE — Discharge Instructions (Signed)
Hypertension Hypertension is another name for high blood pressure. High blood pressure forces your heart to work harder to pump blood. A blood pressure reading has two numbers, which includes a higher number over a lower number (example: 110/72). Follow these instructions at home:  Have your blood pressure rechecked by your doctor.  Only take medicine as told by your doctor. Follow the directions carefully. The medicine does not work as well if you skip doses. Skipping doses also puts you at risk for problems.  Do not smoke.  Monitor your blood pressure at home as told by your doctor. Contact a doctor if:  You think you are having a reaction to the medicine you are taking.  You have repeat headaches or feel dizzy.  You have puffiness (swelling) in your ankles.  You have trouble with your vision. Get help right away if:  You get a very bad headache and are confused.  You feel weak, numb, or faint.  You get chest or belly (abdominal) pain.  You throw up (vomit).  You cannot breathe very well. This information is not intended to replace advice given to you by your health care provider. Make sure you discuss any questions you have with your health care provider. Document Released: 03/12/2008 Document Revised: 03/01/2016 Document Reviewed: 07/17/2013 Elsevier Interactive Patient Education  2017 [employer] Inc[employer]   Hypertension Hypertension, commonly called high blood pressure, is when the force of blood pumping through your arteries is too strong. Your arteries are the blood vessels that carry blood from your heart throughout your body. A blood pressure reading consists of a higher number over a lower number, such as 110/72. The higher number (systolic) is the pressure inside your arteries when your heart pumps. The lower number (diastolic) is the pressure inside your arteries when your heart relaxes. Ideally you want your blood pressure below 120/80. Hypertension forces your heart to  work harder to pump blood. Your arteries may become narrow or stiff. Having untreated or uncontrolled hypertension can cause heart attack, stroke, kidney disease, and other problems. What increases the risk? Some risk factors for high blood pressure are controllable. Others are not. Risk factors you cannot control include:  Race. You may be at higher risk if you are African American.  Age. Risk increases with age.  Gender. Men are at higher risk than women before age 25 years. After age 25, women are at higher risk than men. Risk factors you can control include:  Not getting enough exercise or physical activity.  Being overweight.  Getting too much fat, sugar, calories, or salt in your diet.  Drinking too much alcohol. What are the signs or symptoms? Hypertension does not usually cause signs or symptoms. Extremely high blood pressure (hypertensive crisis) may cause headache, anxiety, shortness of breath, and nosebleed. How is this diagnosed? To check if you have hypertension, your health care provider will measure your blood pressure while you are seated, with your arm held at the level of your heart. It should be measured at least twice using the same arm. Certain conditions can cause a difference in blood pressure between your right and left arms. A blood pressure reading that is higher than normal on one occasion does not mean that you need treatment. If it is not clear whether you have high blood pressure, you may be asked to return on a different day to have your blood pressure checked again. Or, you may be asked to monitor your blood pressure at home for 1 or  more weeks. How is this treated? Treating high blood pressure includes making lifestyle changes and possibly taking medicine. Living a healthy lifestyle can help lower high blood pressure. You may need to change some of your habits. Lifestyle changes may include:  Following the DASH diet. This diet is high in fruits, vegetables,  and whole grains. It is low in salt, red meat, and added sugars.  Keep your sodium intake below 2,300 mg per day.  Getting at least 30-45 minutes of aerobic exercise at least 4 times per week.  Losing weight if necessary.  Not smoking.  Limiting alcoholic beverages.  Learning ways to reduce stress. Your health care provider may prescribe medicine if lifestyle changes are not enough to get your blood pressure under control, and if one of the following is true:  You are 4118-25 years of age and your systolic blood pressure is above 140.  You are 25 years of age or older, and your systolic blood pressure is above 150.  Your diastolic blood pressure is above 90.  You have diabetes, and your systolic blood pressure is over 140 or your diastolic blood pressure is over 90.  You have kidney disease and your blood pressure is above 140/90.  You have heart disease and your blood pressure is above 140/90. Your personal target blood pressure may vary depending on your medical conditions, your age, and other factors. Follow these instructions at home:  Have your blood pressure rechecked as directed by your health care provider.  Take medicines only as directed by your health care provider. Follow the directions carefully. Blood pressure medicines must be taken as prescribed. The medicine does not work as well when you skip doses. Skipping doses also puts you at risk for problems.  Do not smoke.  Monitor your blood pressure at home as directed by your health care provider. Contact a health care provider if:  You think you are having a reaction to medicines taken.  You have recurrent headaches or feel dizzy.  You have swelling in your ankles.  You have trouble with your vision. Get help right away if:  You develop a severe headache or confusion.  You have unusual weakness, numbness, or feel faint.  You have severe chest or abdominal pain.  You vomit repeatedly.  You have trouble  breathing. This information is not intended to replace advice given to you by your health care provider. Make sure you discuss any questions you have with your health care provider. Document Released: 09/24/2005 Document Revised: 03/01/2016 Document Reviewed: 07/17/2013 Elsevier Interactive Patient Education  2017 [employer]

## 2016-09-23 NOTE — MAU Note (Addendum)
Pt took a pregnancy test at home today. The test came back "weird" It had a horizontal line and vertical line today. Pt reports a positive home test in November, maybe around the 15 th. Bleeding around Thanksgiving. Pt states that she also had a miscarriage in April and this symptoms seemed the same as back in April. Pt also having diarrhea after she eats (about 3-4 times per day) for the last 4 days. Pt also states that she is having vaginal discharge with an odor for one week. Pt is also have some urinary leakage.

## 2016-09-23 NOTE — MAU Note (Signed)
Possible preg.  +test mid Nov.  Bled for 2 days the wk of Thanksgiving. Not sure if she had a miscarriage.  Having some issues, clear d/c with an odor.

## 2016-09-24 LAB — GC/CHLAMYDIA PROBE AMP (~~LOC~~) NOT AT ARMC
Chlamydia: NEGATIVE
Neisseria Gonorrhea: NEGATIVE

## 2016-12-11 ENCOUNTER — Encounter (HOSPITAL_COMMUNITY): Payer: Self-pay | Admitting: Emergency Medicine

## 2016-12-11 ENCOUNTER — Emergency Department (HOSPITAL_COMMUNITY): Payer: Medicaid Other

## 2016-12-11 ENCOUNTER — Emergency Department (HOSPITAL_COMMUNITY)
Admission: EM | Admit: 2016-12-11 | Discharge: 2016-12-11 | Disposition: A | Discharge status: Home / Self Care | Payer: Medicaid Other | Attending: Emergency Medicine | Admitting: Emergency Medicine

## 2016-12-11 DIAGNOSIS — S81012A Laceration without foreign body, left knee, initial encounter: Secondary | ICD-10-CM

## 2016-12-11 DIAGNOSIS — Y939 Activity, unspecified: Secondary | ICD-10-CM | POA: Insufficient documentation

## 2016-12-11 DIAGNOSIS — M79645 Pain in left finger(s): Secondary | ICD-10-CM | POA: Diagnosis not present

## 2016-12-11 DIAGNOSIS — W108XXA Fall (on) (from) other stairs and steps, initial encounter: Secondary | ICD-10-CM | POA: Insufficient documentation

## 2016-12-11 DIAGNOSIS — Y92009 Unspecified place in unspecified non-institutional (private) residence as the place of occurrence of the external cause: Secondary | ICD-10-CM | POA: Insufficient documentation

## 2016-12-11 DIAGNOSIS — Y999 Unspecified external cause status: Secondary | ICD-10-CM | POA: Insufficient documentation

## 2016-12-11 DIAGNOSIS — W19XXXA Unspecified fall, initial encounter: Secondary | ICD-10-CM

## 2016-12-11 DIAGNOSIS — S0081XA Abrasion of other part of head, initial encounter: Secondary | ICD-10-CM | POA: Insufficient documentation

## 2016-12-11 DIAGNOSIS — S0091XA Abrasion of unspecified part of head, initial encounter: Secondary | ICD-10-CM

## 2016-12-11 MED ORDER — BACITRACIN ZINC 500 UNIT/GM EX OINT
TOPICAL_OINTMENT | CUTANEOUS | Status: AC
  Filled 2016-12-11: qty 1.8

## 2016-12-11 MED ORDER — LIDOCAINE-EPINEPHRINE (PF) 2 %-1:200000 IJ SOLN
10.0000 mL | Freq: Once | INTRAMUSCULAR | Status: AC
  Administered 2016-12-11: 10 mL
  Filled 2016-12-11: qty 20

## 2016-12-11 MED ORDER — IBUPROFEN 200 MG PO TABS
600.0000 mg | ORAL_TABLET | Freq: Once | ORAL | Status: AC
  Administered 2016-12-11: 600 mg via ORAL
  Filled 2016-12-11: qty 3

## 2016-12-11 NOTE — ED Triage Notes (Signed)
Per pt fall occurred this evening and pt fell forward. Pt reports she hit her head at this time also. Pt c/o Left knee pain and Laceration is noted to Left knee. Bleeding controlled. Pt also c/o Left thumb pain. Pt AOx4.

## 2016-12-11 NOTE — ED Notes (Signed)
Patient transported to X-ray 

## 2016-12-11 NOTE — ED Provider Notes (Signed)
[facility]-EMERGENCY DEPT Provider Note    By signing my name below, I, Earmon Phoenix, attest that this documentation has been prepared under the direction and in the presence of Melburn Hake, PA-C. Electronically Signed: Earmon Phoenix, ED Scribe. 12/11/16. 6:52 PM.    History   Chief Complaint Chief Complaint  Patient presents with  . Extremity Laceration    Left knee  . Hand Pain    Left thumb    The history is provided by the patient and medical records. No language interpreter was used.    Kylie Huerta is a morbidly obese 26 y.o. female who presents to the Emergency Department complaining of a mechanical fall that occurred 30 minutes ago. She states she fell down 8-9 stairs in her house. She reports left knee pain and swelling, left thumb pain and an abrasion to the left side of her face where she hit her head on the bottom stair. She also reports an abrasion to the left knee. Pt reports hitting a picture frame on the way down the stairs which caused glass to break causing her lacerations. She reports paresthesias in her left foot which has since resovled. She has not taken anything for pain relief. Moving her left knee or left thumb increases her pain. She denies alleviating factors.  She denies LOC, HA, dizziness, nausea, vomiting, vision changes, numbness, tingling or weakness of any extremity, neck or back pain. Her tetanus vaccination is UTD.   History reviewed. No pertinent past medical history.  There are no active problems to display for this patient.   Past Surgical History:  Procedure Laterality Date  . CESAREAN SECTION      OB History    Gravida Para Term Preterm AB Living   1 1 1     1    SAB TAB Ectopic Multiple Live Births           1       Home Medications    Prior to Admission medications   Medication Sig Start Date End Date Taking? Authorizing Provider  naproxen (NAPROSYN) 500 MG tablet Take 1 tablet (500 mg total) by mouth 2 (two) times daily.  06/14/16   Cy Blamer, MD    Family History Family History  Problem Relation Age of Onset  . Kidney failure Father   . Other Mother     brain tumor  . Kidney disease Mother     Social History Social History  Substance Use Topics  . Smoking status: Never Smoker  . Smokeless tobacco: Never Used  . Alcohol use No     Allergies   Almond oil; Other; and Kiwi extract   Review of Systems Review of Systems  Eyes: Negative for visual disturbance.  Gastrointestinal: Negative for nausea and vomiting.  Musculoskeletal: Positive for arthralgias and joint swelling.  Skin: Positive for wound.  Neurological: Negative for syncope, weakness and numbness.     Physical Exam Updated Vital Signs BP (!) 172/118 (BP Location: Right Arm)   Pulse 111   Temp 99.1 F (37.3 C) (Oral)   Resp 18   Ht 5\' 8"  (1.727 m)   Wt 300 lb (136.1 kg)   LMP 11/27/2016   SpO2 96%   BMI 45.61 kg/m   Physical Exam  Constitutional: She is oriented to person, place, and time. She appears well-developed and well-nourished.  HENT:  Head: Normocephalic. Head is with abrasion. Head is without raccoon's eyes, without Battle's sign, without contusion and without laceration.  Right Ear: Hearing, tympanic  membrane, external ear and ear canal normal. No hemotympanum.  Left Ear: Hearing, tympanic membrane, external ear and ear canal normal. No hemotympanum.  Nose: Nose normal. No sinus tenderness, nasal deformity, septal deviation or nasal septal hematoma. No epistaxis.  Mouth/Throat: Uvula is midline, oropharynx is clear and moist and mucous membranes are normal. No oropharyngeal exudate, posterior oropharyngeal edema, posterior oropharyngeal erythema or tonsillar abscesses.  Small abrasion present to right lateral forehead, no active bleeding.  Eyes: Conjunctivae and EOM are normal. Pupils are equal, round, and reactive to light. Right eye exhibits no discharge. Left eye exhibits no discharge. No scleral icterus.    Neck: Normal range of motion. Neck supple.  Cardiovascular: Normal rate, regular rhythm, normal heart sounds and intact distal pulses.  Exam reveals no gallop and no friction rub.   No murmur heard. HR 94  Pulmonary/Chest: Effort normal and breath sounds normal. No respiratory distress. She has no wheezes. She has no rales.  Abdominal: Soft. She exhibits no distension and no mass. There is no tenderness. There is no rebound and no guarding.  Musculoskeletal: She exhibits tenderness. She exhibits no edema or deformity.  No midline C, T, or L tenderness. Full range of motion of neck and back. Full range of motion of bilateral upper and lower extremities, with 5/5 strength. Sensation intact. 2+ radial and PT pulses. Cap refill <2 seconds. Patient able to stand and ambulate without assistance. Mild swelling and diffuse tenderness to left anterior knee. Decreased ROM due to reported pain. Full ROM of left foot, ankle and hip. Dorsalis Pedis pulses 2+ bilaterally. Sensations grossly intact. Diffuse tenderness of left thumb. No swelling, erythema, abrasion or contusion present. Decreased ROM due to pain. Full ROM of digits 2-5, wrist, forearm and elbow.  Neurological: She is alert and oriented to person, place, and time. She has normal strength. No cranial nerve deficit or sensory deficit. Coordination and gait normal.  Skin: Skin is warm and dry. Capillary refill takes less than 2 seconds.  3.5 cm superficial laceration to left anterior knee. No active bleeding. No visible foreign bodies.  Nursing note and vitals reviewed.    ED Treatments / Results  DIAGNOSTIC STUDIES: Oxygen Saturation is 96% on RA, adequate by my interpretation.   COORDINATION OF CARE: 6:46 PM- Will order pain medication prior to imaging. Pt verbalizes understanding and agrees to plan.  Medications  lidocaine-EPINEPHrine (XYLOCAINE W/EPI) 2 %-1:200000 (PF) injection 10 mL (10 mLs Infiltration Given 12/11/16 1901)  ibuprofen  (ADVIL,MOTRIN) tablet 600 mg (600 mg Oral Given 12/11/16 1901)    Labs (all labs ordered are listed, but only abnormal results are displayed) Labs Reviewed - No data to display  EKG  EKG Interpretation None       Radiology Dg Knee Complete 4 Views Left  Result Date: 12/11/2016 CLINICAL DATA:  Larey Seat down steps at home tonight. Patellar pain and laceration. EXAM: LEFT KNEE - COMPLETE 4+ VIEW COMPARISON:  None. FINDINGS: No acute fracture deformity or dislocation. Joint spaces intact without erosions. No destructive bony lesions. Soft tissue planes are not suspicious. Large body habitus. IMPRESSION: Negative. Electronically Signed   By: Awilda Metro M.D.   On: 12/11/2016 19:42   Dg Hand Complete Left  Result Date: 12/11/2016 CLINICAL DATA:  Larey Seat down steps tonight.  LEFT hand pain. EXAM: LEFT HAND - COMPLETE 3+ VIEW COMPARISON:  None. FINDINGS: There is no evidence of fracture or dislocation. There is no evidence of arthropathy or other focal bone abnormality. Soft tissues are unremarkable.  IMPRESSION: Negative. Electronically Signed   By: Awilda Metro M.D.   On: 12/11/2016 19:43    Procedures .Marland KitchenLaceration Repair Date/Time: 12/11/2016 7:13 PM Performed by: Barrett Henle Authorized by: Barrett Henle   Consent:    Consent obtained:  Verbal   Consent given by:  Patient Anesthesia (see MAR for exact dosages):    Anesthesia method:  Local infiltration   Local anesthetic:  Lidocaine 2% WITH epi Laceration details:    Location:  Leg   Leg location:  L knee   Length (cm):  3.5 Repair type:    Repair type:  Simple Pre-procedure details:    Preparation:  Patient was prepped and draped in usual sterile fashion and imaging obtained to evaluate for foreign bodies Exploration:    Wound exploration: wound explored through full range of motion and entire depth of wound probed and visualized     Wound extent: no foreign bodies/material noted, no muscle damage noted,  no nerve damage noted, no tendon damage noted, no underlying fracture noted and no vascular damage noted   Treatment:    Area cleansed with:  Betadine and saline   Amount of cleaning:  Standard   Irrigation solution:  Sterile saline   Irrigation method:  Syringe   Visualized foreign bodies/material removed: no   Skin repair:    Repair method:  Sutures   Suture size:  4-0   Suture material:  Prolene   Suture technique:  Simple interrupted   Number of sutures:  5 Approximation:    Approximation:  Close   Vermilion border: well-aligned   Post-procedure details:    Dressing:  Antibiotic ointment and non-adherent dressing   Patient tolerance of procedure:  Tolerated well, no immediate complications    (including critical care time)  Medications Ordered in ED Medications  lidocaine-EPINEPHrine (XYLOCAINE W/EPI) 2 %-1:200000 (PF) injection 10 mL (10 mLs Infiltration Given 12/11/16 1901)  ibuprofen (ADVIL,MOTRIN) tablet 600 mg (600 mg Oral Given 12/11/16 1901)     Initial Impression / Assessment and Plan / ED Course  I have reviewed the triage vital signs and the nursing notes.  Pertinent labs & imaging results that were available during my care of the patient were reviewed by me and considered in my medical decision making (see chart for details).     Patient presents status post mechanical fall that occurred prior to arrival with reported head injury and left knee laceration. Denies LOC. VSS. Exam revealed small abrasion to right forehead, tenderness to left thumb and mild swelling, tenderness and laceration to left anterior knee. No other evidence of head injury. No neuro deficits. Xrays negative. Pressure irrigation performed. Wound explored and base of wound visualized in a bloodless field without evidence of foreign body.  Laceration occurred < 8 hours prior to repair which was well tolerated. Tdap UTD.  Pt has no comorbidities to effect normal wound healing. Pt discharged without  antibiotics.  Discussed suture home care with patient and answered questions. Pt to follow-up for wound check and suture removal in 7 days; they are to return to the ED sooner for signs of infection. Pt is hemodynamically stable with no complaints prior to dc.    I personally performed the services described in this documentation, which was scribed in my presence. The recorded information has been reviewed and is accurate.   Final Clinical Impressions(s) / ED Diagnoses   Final diagnoses:  Fall, initial encounter  Laceration of left knee, initial encounter  Abrasion of head, initial  encounter  Pain of left thumb    New Prescriptions New Prescriptions   No medications on file     Barrett Henleicole Elizabeth Nadeau, [place]PA-C 12/11/16 2004    Maia PlanJoshua G Long, MD 12/12/16 [phone]

## 2016-12-11 NOTE — Discharge Instructions (Signed)
Keep wound clean using soap and water, pat dry. He may apply a small amount of antibiotic ointment to wound daily. I recommend taking 600 mg ibuprofen every 6 hours as needed for pain relief. I also recommend resting, elevating and applying ice to affected areas for 15-20 minutes 3-4 times daily.  Follow-up with the orthopedic office listed below within the next week if your knee pain has not improved. Please return to the Emergency Department if symptoms worsen or new onset of fever, headache, neck stiffness, visual changes, dizziness, confusion, altered mental status, chest pain, difficulty breathing, numbness, weakness, vomiting, syncope, seizure.

## 2016-12-12 ENCOUNTER — Encounter (HOSPITAL_COMMUNITY): Payer: Self-pay | Admitting: Emergency Medicine

## 2016-12-12 ENCOUNTER — Emergency Department (HOSPITAL_COMMUNITY)
Admission: EM | Admit: 2016-12-12 | Discharge: 2016-12-12 | Disposition: A | Discharge status: Home / Self Care | Payer: Medicaid Other | Attending: Emergency Medicine | Admitting: Emergency Medicine

## 2016-12-12 DIAGNOSIS — M25562 Pain in left knee: Secondary | ICD-10-CM | POA: Diagnosis not present

## 2016-12-12 DIAGNOSIS — Z4801 Encounter for change or removal of surgical wound dressing: Secondary | ICD-10-CM | POA: Diagnosis not present

## 2016-12-12 DIAGNOSIS — Z5189 Encounter for other specified aftercare: Secondary | ICD-10-CM

## 2016-12-12 DIAGNOSIS — Z79899 Other long term (current) drug therapy: Secondary | ICD-10-CM | POA: Insufficient documentation

## 2016-12-12 NOTE — ED Triage Notes (Signed)
Pt was here yesterday for stitches. Stitches popped open and pt states it is very painful.

## 2016-12-12 NOTE — ED Provider Notes (Signed)
[facility]-EMERGENCY DEPT Provider Note   CSN: [id number] Arrival date & time: 12/12/16  [id number]   By signing my name below, I, Clovis PuAvnee Patel, attest that this documentation has been prepared under the direction and in the presence of  AetnaMina Sekai Gitlin, PA-C. Electronically Signed: Clovis PuAvnee Patel, ED Scribe. 12/12/16. 8:40 PM.  History   Chief Complaint Chief Complaint  Patient presents with  . Wound Check    Stitches opened.   The history is provided by the patient. No language interpreter was used.   HPI Comments:  Kylie Huerta is a 26 y.o. female who presents to the Emergency Department needing a wound check for a laceration repair on her left knee which was done yesterday. Pt states she was changing her bandage when she noticed 1 suture had come undone. She reports there was bleeding earlier which stopped and notes "10/10" associated left knee pain. Her pain is worse when bearing weight on her left lower extremity. No alleviating factors noted. Pt denies any other associated symptoms.   History reviewed. No pertinent past medical history.  There are no active problems to display for this patient.   Past Surgical History:  Procedure Laterality Date  . CESAREAN SECTION      OB History    Gravida Para Term Preterm AB Living   1 1 1     1    SAB TAB Ectopic Multiple Live Births           1       Home Medications    Prior to Admission medications   Medication Sig Start Date End Date Taking? Authorizing Provider  naproxen (NAPROSYN) 500 MG tablet Take 1 tablet (500 mg total) by mouth 2 (two) times daily. 06/14/16   Cy BlamerApril Palumbo, MD    Family History Family History  Problem Relation Age of Onset  . Kidney failure Father   . Other Mother     brain tumor  . Kidney disease Mother     Social History Social History  Substance Use Topics  . Smoking status: Never Smoker  . Smokeless tobacco: Never Used  . Alcohol use No     Allergies   Almond oil; Other; and Kiwi  extract   Review of Systems Review of Systems  Constitutional: Negative for fever.  Musculoskeletal: Positive for arthralgias.  Skin: Positive for wound.   Physical Exam Updated Vital Signs BP (!) 151/106 (BP Location: Left Arm)   Pulse 94   Temp 99.1 F (37.3 C) (Oral)   Resp 16   Ht 5\' 8"  (1.727 m)   Wt 300 lb (136.1 kg)   LMP 11/27/2016   SpO2 99%   BMI 45.61 kg/m   Physical Exam  Constitutional: She is oriented to person, place, and time. She appears well-developed and well-nourished. No distress.  HENT:  Head: Normocephalic and atraumatic.  Eyes: Conjunctivae are normal.  Cardiovascular: Normal rate.   Pulmonary/Chest: Effort normal.  Abdominal: She exhibits no distension.  Musculoskeletal:       Legs: Left knee with 3.5 cm laceration and 4 sutures which remain intact, one middle suture that appears to have come undone but still in skin. Small amount of serosanguinous fluid seen coming from laceration, but no bleeding, purulence, or evidence of wound dehiscence. Appears to be healing well.   Neurological: She is alert and oriented to person, place, and time.  Skin: Skin is warm and dry.  Psychiatric: She has a normal mood and affect.  Nursing note and vitals reviewed.  ED Treatments / Results  DIAGNOSTIC STUDIES:  Oxygen Saturation is 99% on RA, normal by my interpretation.    COORDINATION OF CARE:  8:37 PM Discussed treatment plan with pt at bedside and pt agreed to plan.  Labs (all labs ordered are listed, but only abnormal results are displayed) Labs Reviewed - No data to display  EKG  EKG Interpretation None       Radiology Dg Knee Complete 4 Views Left  Result Date: 12/11/2016 CLINICAL DATA:  Larey Seat down steps at home tonight. Patellar pain and laceration. EXAM: LEFT KNEE - COMPLETE 4+ VIEW COMPARISON:  None. FINDINGS: No acute fracture deformity or dislocation. Joint spaces intact without erosions. No destructive bony lesions. Soft tissue planes  are not suspicious. Large body habitus. IMPRESSION: Negative. Electronically Signed   By: Awilda Metro M.D.   On: 12/11/2016 19:42   Dg Hand Complete Left  Result Date: 12/11/2016 CLINICAL DATA:  Larey Seat down steps tonight.  LEFT hand pain. EXAM: LEFT HAND - COMPLETE 3+ VIEW COMPARISON:  None. FINDINGS: There is no evidence of fracture or dislocation. There is no evidence of arthropathy or other focal bone abnormality. Soft tissues are unremarkable. IMPRESSION: Negative. Electronically Signed   By: Awilda Metro M.D.   On: 12/11/2016 19:43    Procedures Procedures (including critical care time)  Medications Ordered in ED Medications - No data to display   Initial Impression / Assessment and Plan / ED Course  I have reviewed the triage vital signs and the nursing notes.  Pertinent labs & imaging results that were available during my care of the patient were reviewed by me and considered in my medical decision making (see chart for details).     25yof returns to ED today after visit yesterday s/p mechanical fall in which she obtained a knee laceration. Laceration repaired with 5 sutures yesterday. Pt presents today after removing her dressing and noting the middle suture had broken. Wound appears to be healing well with no evidence of purulence or dehiscence. Middle suture removed from skin,which pt tolerated well. Fitted pt with knee immobilizer and crutches due to knee pain and need to limit knee movement while laceration heals. Pt stable for dc, discussed indications for return to ED. She will return in 6 days for wound check and removal of sutures.   Final Clinical Impressions(s) / ED Diagnoses   Final diagnoses:  None    New Prescriptions New Prescriptions   No medications on file   I personally performed the services described in this documentation, which was scribed in my presence. The recorded information has been reviewed and is accurate.     Jeanie Sewer,  [place] 12/12/16 2215    Canary Brim Tegeler, MD 12/13/16 [phone]

## 2016-12-21 ENCOUNTER — Encounter (HOSPITAL_COMMUNITY): Payer: Self-pay | Admitting: *Deleted

## 2016-12-21 ENCOUNTER — Encounter (HOSPITAL_COMMUNITY): Payer: Self-pay

## 2016-12-21 ENCOUNTER — Emergency Department (HOSPITAL_COMMUNITY)
Admission: EM | Admit: 2016-12-21 | Discharge: 2016-12-21 | Disposition: A | Discharge status: Home / Self Care | Payer: Medicaid Other | Attending: Emergency Medicine | Admitting: Emergency Medicine

## 2016-12-21 ENCOUNTER — Emergency Department (HOSPITAL_COMMUNITY)
Admission: EM | Admit: 2016-12-21 | Discharge: 2016-12-21 | Disposition: A | Discharge status: Home / Self Care | Payer: Medicaid Other | Source: Home / Self Care | Attending: Emergency Medicine | Admitting: Emergency Medicine

## 2016-12-21 DIAGNOSIS — Z4802 Encounter for removal of sutures: Secondary | ICD-10-CM | POA: Diagnosis not present

## 2016-12-21 DIAGNOSIS — Y658 Other specified misadventures during surgical and medical care: Secondary | ICD-10-CM | POA: Diagnosis not present

## 2016-12-21 DIAGNOSIS — Z5189 Encounter for other specified aftercare: Secondary | ICD-10-CM

## 2016-12-21 DIAGNOSIS — Z5321 Procedure and treatment not carried out due to patient leaving prior to being seen by health care provider: Secondary | ICD-10-CM

## 2016-12-21 DIAGNOSIS — T8130XA Disruption of wound, unspecified, initial encounter: Secondary | ICD-10-CM | POA: Insufficient documentation

## 2016-12-21 DIAGNOSIS — Z4801 Encounter for change or removal of surgical wound dressing: Secondary | ICD-10-CM | POA: Insufficient documentation

## 2016-12-21 NOTE — ED Provider Notes (Signed)
[facility]-EMERGENCY DEPT Provider Note   CSN: [id number] Arrival date & time: 12/21/16  4098     History   Chief Complaint Chief Complaint  Patient presents with  . Wound Check    HPI Kylie Huerta is a 26 y.o. female.  Results today for evaluation of wound on her left knee that she sustained approximately 9 days ago.  She has since had one visit for concerns of sutures breaking.  She states that today she took the dressing off her wound and noticed that the upper portion of it had opened up. She reports that there was orange drainage on the bandage. She denies any fevers chills or generally feeling unwell. She reports that she has not been using the knee immobilizer that she was given that her last visit.  She denies any new swelling, redness, and green/white discharge from the wound.  There is minimal pain, says that it feels sharp and stabbing, 2/10.  Made worse with movement.        History reviewed. No pertinent past medical history.  There are no active problems to display for this patient.   Past Surgical History:  Procedure Laterality Date  . CESAREAN SECTION      OB History    Gravida Para Term Preterm AB Living   1 1 1     1    SAB TAB Ectopic Multiple Live Births           1       Home Medications    Prior to Admission medications   Medication Sig Start Date End Date Taking? Authorizing Provider  naproxen (NAPROSYN) 500 MG tablet Take 1 tablet (500 mg total) by mouth 2 (two) times daily. 06/14/16   Cy Blamer, MD    Family History Family History  Problem Relation Age of Onset  . Kidney failure Father   . Other Mother     brain tumor  . Kidney disease Mother     Social History Social History  Substance Use Topics  . Smoking status: Never Smoker  . Smokeless tobacco: Never Used  . Alcohol use No     Allergies   Almond oil; Other; and Kiwi extract   Review of Systems Review of Systems  Constitutional: Negative for chills, diaphoresis,  fatigue and fever.  Musculoskeletal: Negative for arthralgias, joint swelling and myalgias.  Skin: Positive for wound. Negative for color change, pallor and rash.  Neurological: Negative for weakness.     Physical Exam Updated Vital Signs BP 132/84   Pulse 84   Temp 98.1 F (36.7 C)   Resp 16   LMP 11/27/2016   SpO2 99%   Physical Exam  Constitutional: She appears well-developed and well-nourished. No distress.  HENT:  Head: Normocephalic and atraumatic.  Eyes: Conjunctivae are normal.  Neck: Normal range of motion.  Cardiovascular: Normal rate.   Pulmonary/Chest: Effort normal. No stridor.  Abdominal: She exhibits no distension.  Musculoskeletal: She exhibits no deformity.  Neurological: She is alert.  Skin: Skin is warm and dry. Laceration noted.     Wound on left anterior knee.  approx 1.5 cm dehiscence on the superior aspect of the wound.  The two superior sutures were not holding the medial aspect of the wound.  The inferior two sutures were intact.  No drainage from the wound.  Area was not indurated, no area of fluctuance or increased temperature noted.    Psychiatric: She has a normal mood and affect. Her behavior is normal.  Nursing note and vitals reviewed.    ED Treatments / Results  Labs (all labs ordered are listed, but only abnormal results are displayed) Labs Reviewed - No data to display  EKG  EKG Interpretation None       Radiology No results found.  Procedures .Suture Removal Date/Time: 12/21/2016 9:21 PM Performed by: Cristina GongHAMMOND, Laiylah Roettger W Authorized by: Cristina GongHAMMOND, Shadaya Marschner W   Consent:    Consent obtained:  Verbal   Consent given by:  Patient   Risks discussed:  Bleeding, pain and wound separation   Alternatives discussed:  No treatment and delayed treatment Location:    Location:  Lower extremity   Lower extremity location:  Knee   Knee location:  L knee Procedure details:    Wound appearance:  No signs of infection, draining,  nonpurulent, clean, moist and pink   Number of sutures removed:  4 Post-procedure details:    Post-removal:  Dressing applied and Steri-Strips applied   Patient tolerance of procedure:  Tolerated well, no immediate complications   (including   Medications Ordered in ED Medications - No data to display   Initial Impression / Assessment and Plan / ED Course  I have reviewed the triage vital signs and the nursing notes.  Pertinent labs & imaging results that were available during my care of the patient were reviewed by me and considered in my medical decision making (see chart for details).    Suture removal   Pt to ER for staple/suture removal and wound check as above. Procedure tolerated well. Vitals normal, no signs of infection. Wound has dehisced on the superior aspect.  Scar minimization & return precautions given at dc. Patient was informed the remainder of the wound may open up.  Given return precautions and she voiced her understanding.     Final Clinical Impressions(s) / ED Diagnoses   Final diagnoses:  Visit for wound check  Wound dehiscence  Encounter for removal of sutures    New Prescriptions New Prescriptions   No medications on file     Cristina Gonglizabeth W Kylle Lall, [place] 12/21/16 2122    Shaune Pollackameron Isaacs, MD 12/22/16 1313

## 2016-12-21 NOTE — ED Triage Notes (Signed)
Pt reports fallling on R knee & [facility] & received a stitch in her L knee last wk, pt reports the stitch came out & the wound has an odor, no redness, swelling or drainage noted at the area, pt states, "I have not had a tetanus shot in over 10 years." A&O x4

## 2016-12-21 NOTE — ED Triage Notes (Signed)
Pt seen for laceration to knee on 3/7.  Wound has reopened and appears infected.

## 2017-07-17 ENCOUNTER — Ambulatory Visit: Payer: Self-pay | Admitting: Advanced Practice Midwife

## 2017-08-01 DIAGNOSIS — F3342 Major depressive disorder, recurrent, in full remission: Secondary | ICD-10-CM | POA: Insufficient documentation

## 2017-08-01 DIAGNOSIS — Z6833 Body mass index (BMI) 33.0-33.9, adult: Secondary | ICD-10-CM | POA: Insufficient documentation

## 2017-08-01 DIAGNOSIS — F332 Major depressive disorder, recurrent severe without psychotic features: Secondary | ICD-10-CM | POA: Insufficient documentation

## 2017-08-01 DIAGNOSIS — Z6836 Body mass index (BMI) 36.0-36.9, adult: Secondary | ICD-10-CM | POA: Insufficient documentation

## 2017-08-01 DIAGNOSIS — Z6838 Body mass index (BMI) 38.0-38.9, adult: Secondary | ICD-10-CM | POA: Insufficient documentation

## 2017-08-01 HISTORY — DX: Major depressive disorder, recurrent, in full remission: F33.42

## 2017-10-04 ENCOUNTER — Emergency Department (HOSPITAL_COMMUNITY)
Admit: 2017-10-04 | Discharge: 2017-10-05 | Disposition: A | Discharge status: Home / Self Care | Payer: Medicaid Other | Attending: Emergency Medicine | Admitting: Emergency Medicine

## 2017-10-04 DIAGNOSIS — Z5321 Procedure and treatment not carried out due to patient leaving prior to being seen by health care provider: Secondary | ICD-10-CM | POA: Insufficient documentation

## 2017-10-05 NOTE — ED Notes (Signed)
Patient requested to leave due to wait time.

## 2018-07-10 DIAGNOSIS — R519 Headache, unspecified: Secondary | ICD-10-CM | POA: Insufficient documentation

## 2018-07-10 LAB — HM HEPATITIS C SCREENING LAB: HM Hepatitis Screen: NEGATIVE

## 2018-07-10 LAB — HIV ANTIBODY (ROUTINE TESTING W REFLEX): HIV 1&2 Ab, 4th Generation: NONREACTIVE

## 2018-07-10 LAB — HM HIV SCREENING LAB: HM HIV Screening: NEGATIVE

## 2018-12-23 DIAGNOSIS — F3181 Bipolar II disorder: Secondary | ICD-10-CM | POA: Insufficient documentation

## 2019-05-26 ENCOUNTER — Encounter (HOSPITAL_COMMUNITY): Payer: Self-pay | Admitting: Emergency Medicine

## 2019-05-26 ENCOUNTER — Other Ambulatory Visit: Payer: Self-pay

## 2019-05-26 ENCOUNTER — Emergency Department (HOSPITAL_COMMUNITY): Payer: Self-pay

## 2019-05-26 ENCOUNTER — Emergency Department (HOSPITAL_COMMUNITY)
Admission: EM | Admit: 2019-05-26 | Discharge: 2019-05-27 | Disposition: A | Discharge status: Home / Self Care | Payer: Self-pay | Attending: Emergency Medicine | Admitting: Emergency Medicine

## 2019-05-26 DIAGNOSIS — R931 Abnormal findings on diagnostic imaging of heart and coronary circulation: Secondary | ICD-10-CM | POA: Insufficient documentation

## 2019-05-26 DIAGNOSIS — Z79899 Other long term (current) drug therapy: Secondary | ICD-10-CM | POA: Insufficient documentation

## 2019-05-26 DIAGNOSIS — H547 Unspecified visual loss: Secondary | ICD-10-CM | POA: Insufficient documentation

## 2019-05-26 DIAGNOSIS — R51 Headache: Secondary | ICD-10-CM | POA: Insufficient documentation

## 2019-05-26 DIAGNOSIS — R519 Headache, unspecified: Secondary | ICD-10-CM

## 2019-05-26 MED ORDER — METOCLOPRAMIDE HCL 5 MG/ML IJ SOLN
10.0000 mg | Freq: Once | INTRAMUSCULAR | Status: AC
  Administered 2019-05-26: 10 mg via INTRAVENOUS
  Filled 2019-05-26: qty 2

## 2019-05-26 MED ORDER — SODIUM CHLORIDE 0.9% FLUSH: 3.0000 mL | Freq: Once | INTRAVENOUS | Status: DC

## 2019-05-26 MED ORDER — ONDANSETRON 4 MG PO TBDP: 4.0000 mg | ORAL_TABLET | Freq: Once | ORAL | Status: DC | PRN

## 2019-05-26 MED ORDER — DIPHENHYDRAMINE HCL 50 MG/ML IJ SOLN
25.0000 mg | Freq: Once | INTRAMUSCULAR | Status: AC
  Administered 2019-05-26: 25 mg via INTRAVENOUS
  Filled 2019-05-26: qty 1

## 2019-05-26 MED ORDER — ACETAMINOPHEN 325 MG PO TABS
650.0000 mg | ORAL_TABLET | Freq: Once | ORAL | Status: AC
  Administered 2019-05-27: 04:00:00 650 mg via ORAL
  Filled 2019-05-26: qty 2

## 2019-05-26 MED ORDER — SODIUM CHLORIDE 0.9 % IV BOLUS
1000.0000 mL | Freq: Once | INTRAVENOUS | Status: AC
  Administered 2019-05-26: 1000 mL via INTRAVENOUS

## 2019-05-26 NOTE — ED Provider Notes (Signed)
MSE was initiated and I personally evaluated the patient and placed orders (if any) at  11:07 PM on May 26, 2019.  I was called to triage as patient presents with severe headache after waking up from a nap. Patient does not have a prior history of migraines according to the chart review I have performed. Describes this pain as shooting sensation from the back of her neck radiating to the front of her head. Patient states "I have never felt pain like this fever in my head". She had multiple episodes of vomiting in triage.  Upon arrival to triage patient was lying on the floor, crying, teary-eyed.  Physical Exam  Constitutional: She is oriented to person, place, and time. She appears distressed.  HENT:  Head: Normocephalic.  Eyes: Pupils are equal, round, and reactive to light.  Neck: Normal range of motion. Neck supple.  Cardiovascular: Normal rate.  Pulmonary/Chest: Effort normal.  Abdominal: Soft. There is no guarding.  Musculoskeletal:        General: No tenderness.  Neurological: She is oriented to person, place, and time.  Skin: She is diaphoretic.   A CT Head was order to r/o any hemorrhage, infarct.  A Chem-8 was also ordered along with a CT angios to rule out any subarachnoid.  Patient was reassessed by me while in the ED room, reports pain began yesterday, woke up with a worsened headache after a nap today.  She is alert and oriented, diaphoretic with soft pressures will order 1 L bolus along with some labs for further screening.   The patient appears stable so that the remainder of the MSE may be completed by another provider.     Janeece Fitting, PA-C 05/26/19 2316    Lennice Sites, DO 05/26/19 2353

## 2019-05-26 NOTE — ED Notes (Signed)
Pt got out of triage room and decide to place her self on the floor. PA notified and here to evaluate the pt. Pt is refusing to have Tylenol given and this time. Still crying having family member on the phone on speaker the whole time.

## 2019-05-26 NOTE — ED Triage Notes (Addendum)
Pt c/o Headache that stated 15 min ago, is very emotional and crying on triage, no neuro deficit noticed, pt is vomiting on triage refuse to have something for nausea, requesting only pain medication.

## 2019-05-26 NOTE — ED Notes (Signed)
No neuro deficit noticed on triage, pt still crying and refusing for this RN to touch her for assessment.

## 2019-05-27 ENCOUNTER — Emergency Department (HOSPITAL_COMMUNITY): Payer: Self-pay

## 2019-05-27 DIAGNOSIS — R51 Headache: Secondary | ICD-10-CM

## 2019-05-27 LAB — CBC WITH DIFFERENTIAL/PLATELET
Abs Immature Granulocytes: 0.02 10*3/uL (ref 0.00–0.07)
Basophils Absolute: 0 10*3/uL (ref 0.0–0.1)
Basophils Relative: 0 %
Eosinophils Absolute: 0.1 10*3/uL (ref 0.0–0.5)
Eosinophils Relative: 1 %
HCT: 45.4 % (ref 36.0–46.0)
Hemoglobin: 14.5 g/dL (ref 12.0–15.0)
Immature Granulocytes: 0 %
Lymphocytes Relative: 21 %
Lymphs Abs: 1.6 10*3/uL (ref 0.7–4.0)
MCH: 27.4 pg (ref 26.0–34.0)
MCHC: 31.9 g/dL (ref 30.0–36.0)
MCV: 85.8 fL (ref 80.0–100.0)
Monocytes Absolute: 0.4 10*3/uL (ref 0.1–1.0)
Monocytes Relative: 5 %
Neutro Abs: 5.7 10*3/uL (ref 1.7–7.7)
Neutrophils Relative %: 73 %
Platelets: 312 10*3/uL (ref 150–400)
RBC: 5.29 MIL/uL — ABNORMAL HIGH (ref 3.87–5.11)
RDW: 13.1 % (ref 11.5–15.5)
WBC: 7.9 10*3/uL (ref 4.0–10.5)
nRBC: 0 % (ref 0.0–0.2)

## 2019-05-27 LAB — COMPREHENSIVE METABOLIC PANEL
ALT: 15 U/L (ref 0–44)
AST: 21 U/L (ref 15–41)
Albumin: 4.5 g/dL (ref 3.5–5.0)
Alkaline Phosphatase: 73 U/L (ref 38–126)
Anion gap: 11 (ref 5–15)
BUN: 11 mg/dL (ref 6–20)
CO2: 21 mmol/L — ABNORMAL LOW (ref 22–32)
Calcium: 9.5 mg/dL (ref 8.9–10.3)
Chloride: 104 mmol/L (ref 98–111)
Creatinine, Ser: 1 mg/dL (ref 0.44–1.00)
GFR calc Af Amer: >60 mL/min (ref >60)
GFR calc non Af Amer: >60 mL/min (ref >60)
Glucose, Bld: 134 mg/dL — ABNORMAL HIGH (ref 70–99)
Potassium: 3.1 mmol/L — ABNORMAL LOW (ref 3.5–5.1)
Sodium: 136 mmol/L (ref 135–145)
Total Bilirubin: 0.6 mg/dL (ref 0.3–1.2)
Total Protein: 8.6 g/dL — ABNORMAL HIGH (ref 6.5–8.1)

## 2019-05-27 LAB — I-STAT BETA HCG BLOOD, ED (MC, WL, AP ONLY): I-stat hCG, quantitative: <5 m[IU]/mL (ref <5)

## 2019-05-27 LAB — I-STAT CREATININE, ED: Creatinine, Ser: 0.9 mg/dL (ref 0.44–1.00)

## 2019-05-27 IMAGING — CT CT ANGIOGRAPHY HEAD
1 of 8 series · 6 of 47 positions shown · IV contrast (omnipaque)
Comparison: Prior head CT from [DATE].

CLINICAL DATA: Initial evaluation for acute headache, nausea.

EXAM:
CT ANGIOGRAPHY HEAD
TECHNIQUE: Multidetector CT imaging of the head was performed using the
standard protocol during bolus administration of intravenous
contrast. Multiplanar CT image reconstructions and MIPs were
obtained to evaluate the vascular anatomy.
CONTRAST:  75mL OMNIPAQUE IOHEXOL 350 MG/ML SOLN

[Series 5: cow 2.0 · axial · 0.49mm/px · z∈[-198,-76]mm · 6 of 87 slices shown]
[im 13/87  brain]
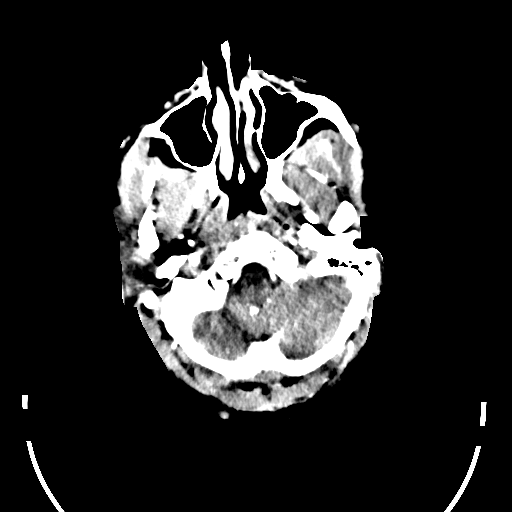
[im 25/87  bone]
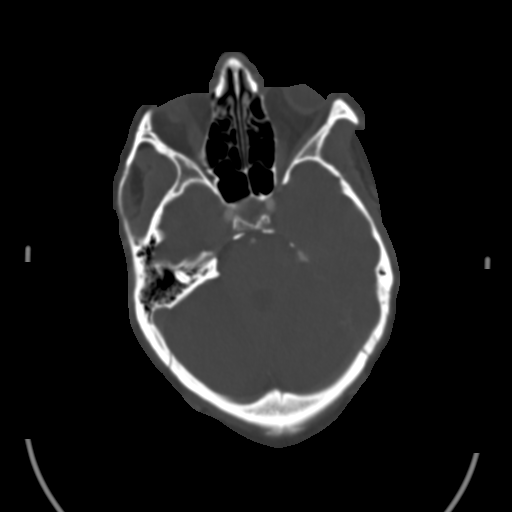
[im 37/87  brain]
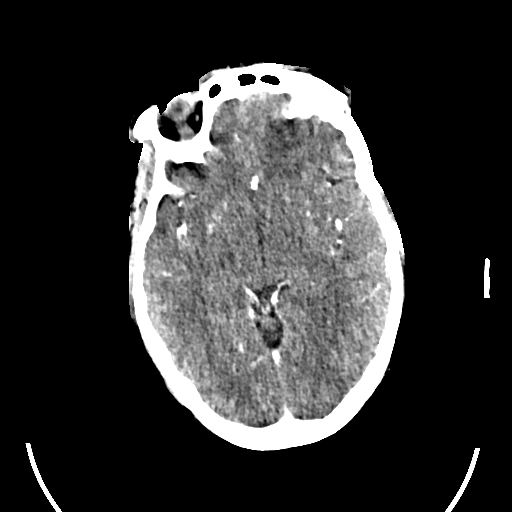
[im 50/87  bone]
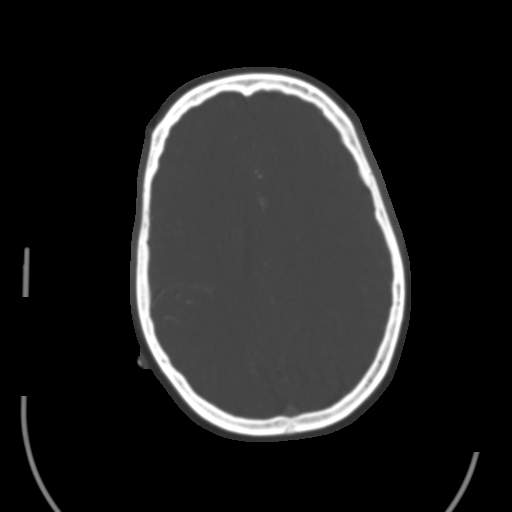
[im 62/87  brain]
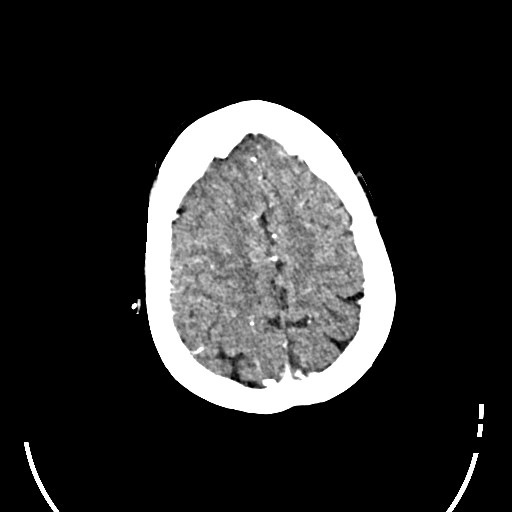
[im 74/87  bone]
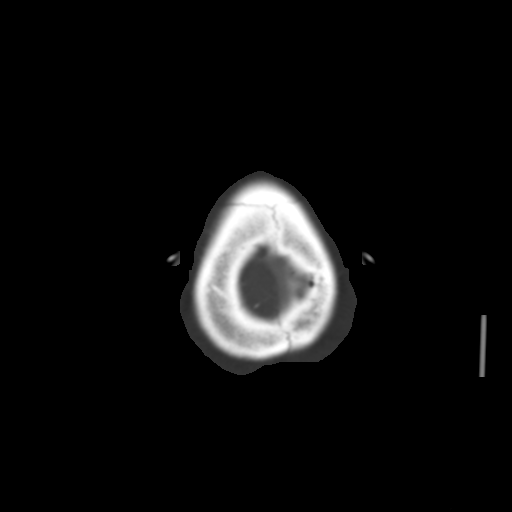

[6 of 47 positions shown; findings below may reference images not displayed]

FINDINGS: CTA HEAD

Anterior circulation: Distal cervical segments of the internal
carotid arteries are widely patent and well opacified bilaterally.
Petrous, cavernous, and supraclinoid segments patent without
stenosis or other abnormality. ICA termini well perfused. A1
segments patent bilaterally. Normal anterior communicating artery
complex. Anterior cerebral arteries widely patent to their distal
aspects without stenosis.

Right M1 widely patent without stenosis. Normal right MCA
bifurcation. Distal right MCA branches well perfused.

Left M1 bifurcates early, with a dominant inferior division. There
is an apparent short-segment moderate to severe stenosis involving
the mid left M1/M2 segment, inferior division (series 10, image 23).
Left MCA branches well perfused distally.

Posterior circulation: Vertebral arteries widely patent to the
vertebrobasilar junction without stenosis. Posterior inferior
cerebellar arteries patent bilaterally. Basilar widely patent to its
distal aspect without stenosis. Superior cerebral arteries patent
bilaterally. PCAs supplied via the basilar as well as robust
bilateral posterior communicating arteries. PCAs well perfused to
their distal aspects without stenosis.

Venous sinuses: Grossly patent allowing for timing the contrast
bolus.

Anatomic variants: Early bifurcation and/or duplication of the left
M1 segment at the ICA terminus.
IMPRESSION: 1. Negative CTA with no large vessel occlusion or other acute
intracranial abnormality.
2. Short-segment moderate to severe stenosis involving the left M1
segment as above. No other hemodynamically significant or
correctable stenosis.
3. No intracranial aneurysm.

## 2019-05-27 MED ORDER — MAGNESIUM SULFATE 2 GM/50ML IV SOLN
2.0000 g | Freq: Once | INTRAVENOUS | Status: AC
  Administered 2019-05-27: 05:00:00 2 g via INTRAVENOUS
  Filled 2019-05-27: qty 50

## 2019-05-27 MED ORDER — VALPROATE SODIUM 500 MG/5ML IV SOLN
1000.0000 mg | Freq: Once | INTRAVENOUS | Status: AC
  Administered 2019-05-27: 05:00:00 1000 mg via INTRAVENOUS
  Filled 2019-05-27: qty 10

## 2019-05-27 MED ORDER — DIPHENHYDRAMINE HCL 50 MG/ML IJ SOLN
25.0000 mg | Freq: Once | INTRAMUSCULAR | Status: AC
  Administered 2019-05-27: 25 mg via INTRAVENOUS
  Filled 2019-05-27: qty 1

## 2019-05-27 MED ORDER — ONDANSETRON HCL 4 MG/2ML IJ SOLN
4.0000 mg | Freq: Once | INTRAMUSCULAR | Status: AC
  Administered 2019-05-27: 04:00:00 4 mg via INTRAVENOUS
  Filled 2019-05-27: qty 2

## 2019-05-27 MED ORDER — IOHEXOL 350 MG/ML SOLN
75.0000 mL | Freq: Once | INTRAVENOUS | Status: AC | PRN
  Administered 2019-05-27: 01:00:00 75 mL via INTRAVENOUS

## 2019-05-27 MED ORDER — KETOROLAC TROMETHAMINE 30 MG/ML IJ SOLN
30.0000 mg | Freq: Once | INTRAMUSCULAR | Status: AC
  Administered 2019-05-27: 05:00:00 30 mg via INTRAVENOUS
  Filled 2019-05-27: qty 1

## 2019-05-27 NOTE — ED Notes (Signed)
Patient transported to MRI 

## 2019-05-27 NOTE — ED Notes (Signed)
Checked on pt and she was sleeping comfortably

## 2019-05-27 NOTE — ED Provider Notes (Addendum)
[facility] EMERGENCY DEPARTMENT Provider Note   CSN: [id number] Arrival date & time: 05/26/19  2221     History   Chief Complaint Chief Complaint  Patient presents with   Headache    HPI Kylie Huerta is a 28 y.o. female.     Patient presents to the emergency department with a chief complaint of headache.  She states that her wife just delivered a baby.  She states that she went to take a nap after delivery.  She states that she woke up with severe headache.  This was accompanied with nausea.  She also reports having right eye vision loss, but this has resolved.  She denies any fever or chills.  Denies any numbness, weakness, tingling.  Medical screening exam performed by another provider, CT imaging of the head was ordered.  She was also given a headache cocktail.  She states that she is feeling improved now, but still has some nausea when she stands up.  She states that she still gets occasional waves of headache.    The history is provided by the patient. No language interpreter was used.    History reviewed. No pertinent past medical history.  There are no active problems to display for this patient.   Past Surgical History:  Procedure Laterality Date   CESAREAN SECTION       OB History    Gravida  1   Para  1   Term  1   Preterm      AB      Living  1     SAB      TAB      Ectopic      Multiple      Live Births  1            Home Medications    Prior to Admission medications   Medication Sig Start Date End Date Taking? Authorizing Provider  naproxen (NAPROSYN) 500 MG tablet Take 1 tablet (500 mg total) by mouth 2 (two) times daily. 06/14/16   Palumbo, April, MD    Family History Family History  Problem Relation Age of Onset   Kidney failure Father    Other Mother        brain tumor   Kidney disease Mother     Social History Social History   Tobacco Use   Smoking status: Never Smoker   Smokeless  tobacco: Never Used  Substance Use Topics   Alcohol use: No   Drug use: No     Allergies   Almond oil, Other, and Kiwi extract   Review of Systems Review of Systems  All other systems reviewed and are negative.    Physical Exam Updated Vital Signs BP (!) 189/111 (BP Location: Left Arm)    Pulse 66    Temp 97.9 F (36.6 C) (Oral)    Resp 20    SpO2 98%   Physical Exam Vitals signs and nursing note reviewed.  Constitutional:      General: She is not in acute distress.    Appearance: She is well-developed.  HENT:     Head: Normocephalic and atraumatic.  Eyes:     Conjunctiva/sclera: Conjunctivae normal.  Neck:     Musculoskeletal: Neck supple.  Cardiovascular:     Rate and Rhythm: Normal rate and regular rhythm.     Heart sounds: No murmur.  Pulmonary:     Effort: Pulmonary effort is normal. No respiratory distress.  Breath sounds: Normal breath sounds.  Abdominal:     Palpations: Abdomen is soft.     Tenderness: There is no abdominal tenderness.  Musculoskeletal: Normal range of motion.  Skin:    General: Skin is warm and dry.  Neurological:     Mental Status: She is alert and oriented to person, place, and time.     Comments: CN III-XII intact, speech is clear, movements are goal oriented, normal gait  Psychiatric:        Mood and Affect: Mood normal.        Behavior: Behavior normal.      ED Treatments / Results  Labs (all labs ordered are listed, but only abnormal results are displayed) Labs Reviewed  CBC WITH DIFFERENTIAL/PLATELET - Abnormal; Notable for the following components:      Result Value   RBC 5.29 (*)    All other components within normal limits  COMPREHENSIVE METABOLIC PANEL - Abnormal; Notable for the following components:   Potassium 3.1 (*)    CO2 21 (*)    Glucose, Bld 134 (*)    Total Protein 8.6 (*)    All other components within normal limits  URINALYSIS, ROUTINE W REFLEX MICROSCOPIC  RAPID URINE DRUG SCREEN, HOSP  PERFORMED  I-STAT BETA HCG BLOOD, ED ([facility], [facility], [facility] ONLY)  I-STAT CREATININE, ED    EKG None  Radiology Ct Angio Head W Or Wo Contrast  Result Date: 05/27/2019 CLINICAL DATA:  Initial evaluation for acute headache, nausea. EXAM: CT ANGIOGRAPHY HEAD TECHNIQUE: Multidetector CT imaging of the head was performed using the standard protocol during bolus administration of intravenous contrast. Multiplanar CT image reconstructions and MIPs were obtained to evaluate the vascular anatomy. CONTRAST:  75mL OMNIPAQUE IOHEXOL 350 MG/ML SOLN COMPARISON:  Prior head CT from 05/26/2019. FINDINGS: CTA HEAD Anterior circulation: Distal cervical segments of the internal carotid arteries are widely patent and well opacified bilaterally. Petrous, cavernous, and supraclinoid segments patent without stenosis or other abnormality. ICA termini well perfused. A1 segments patent bilaterally. Normal anterior communicating artery complex. Anterior cerebral arteries widely patent to their distal aspects without stenosis. Right M1 widely patent without stenosis. Normal right MCA bifurcation. Distal right MCA branches well perfused. Left M1 bifurcates early, with a dominant inferior division. There is an apparent short-segment moderate to severe stenosis involving the mid left M1/M2 segment, inferior division (series 10, image 23). Left MCA branches well perfused distally. Posterior circulation: Vertebral arteries widely patent to the vertebrobasilar junction without stenosis. Posterior inferior cerebellar arteries patent bilaterally. Basilar widely patent to its distal aspect without stenosis. Superior cerebral arteries patent bilaterally. PCAs supplied via the basilar as well as robust bilateral posterior communicating arteries. PCAs well perfused to their distal aspects without stenosis. Venous sinuses: Grossly patent allowing for timing the contrast bolus. Anatomic variants: Early bifurcation and/or duplication of the left M1 segment  at the ICA terminus. IMPRESSION: 1. Negative CTA with no large vessel occlusion or other acute intracranial abnormality. 2. 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 No other hemodynamically significant or correctable stenosis. 3. No intracranial aneurysm. Electronically Signed   By: Rise MuBenjamin  McClintock M.D.   On: 05/27/2019 01:36   Ct Head Wo Contrast  Result Date: 05/26/2019 CLINICAL DATA:  Headache acute, normal neurologic exam EXAM: CT HEAD WITHOUT CONTRAST TECHNIQUE: Contiguous axial images were obtained from the base of the skull through the vertex without intravenous contrast. COMPARISON:  None. FINDINGS: Brain: No evidence of acute infarction, hemorrhage, hydrocephalus,  extra-axial collection or mass lesion/mass effect. Vascular: No hyperdense vessel or unexpected calcification. Skull: No calvarial fracture or suspicious osseous lesion. No scalp swelling or hematoma. Sinuses/Orbits: Paranasal sinuses and mastoid air cells are predominantly clear. Orbital structures are unremarkable. Other: Few prominent suboccipital and parotid lymph nodes are noted, nonspecific. IMPRESSION: No acute intracranial abnormality. Scattered prominent suboccipital and intraparotid lymph nodes, possibly reactive. Electronically Signed   By: Kreg ShropshirePrice  DeHay M.D.   On: 05/26/2019 23:10   Mr Angio Head Wo Contrast  Result Date: 05/27/2019 CLINICAL DATA:  Left M2 segment stenosis.  Abnormal CTA.  Headaches. EXAM: MRA HEAD WITHOUT CONTRAST TECHNIQUE: Angiographic images of the Circle of Willis were obtained using MRA technique without intravenous contrast. COMPARISON:  CTA of the head 05/27/2019 FINDINGS: Normal flow is present in the internal carotid arteries bilaterally from the high cervical segments through the ICA termini. There is early bifurcation, centrally duplication of the left MCA. The apparent stenosis in the superior division is not present on MRA and was likely artifactual.  MCA bifurcations are within normal limits bilaterally. The ACA and MCA branch vessels are normal. The vertebral arteries are small. Basilar artery is small. Bilateral fetal type posterior cerebral arteries are present with small P1 segments bilaterally. PCA branch vessels are within normal limits. IMPRESSION: 1. Normal variant MRA circle-of-Willis without significant proximal stenosis, aneurysm, or branch vessel occlusion. 2. Apparent narrowing on the CTA in the superior left M2 segment is likely artifactual. Electronically Signed   By: Marin Robertshristopher  Mattern M.D.   On: 05/27/2019 06:48    Procedures Procedures (including critical care time)  Medications Ordered in ED Medications  acetaminophen (TYLENOL) tablet 650 mg (has no administration in time range)  sodium chloride 0.9 % bolus 1,000 mL (1,000 mLs Intravenous New Bag/Given 05/26/19 2342)  metoCLOPramide (REGLAN) injection 10 mg (10 mg Intravenous Given 05/26/19 2343)  diphenhydrAMINE (BENADRYL) injection 25 mg (25 mg Intravenous Given 05/26/19 2343)  iohexol (OMNIPAQUE) 350 MG/ML injection 75 mL (75 mLs Intravenous Contrast Given 05/27/19 0041)     Initial Impression / Assessment and Plan / ED Course  I have reviewed the triage vital signs and the nursing notes.  Pertinent labs & imaging results that were available during my care of the patient were reviewed by me and considered in my medical decision making (see chart for details).        Patient with severe headache after awakening from a nap.  She had associated nausea and a few episodes of vomiting.  She has been afebrile.  She was seen in triage, and had CT imaging ordered of her head.  There was no acute/emergent process seen on the CTs, there was short segment moderate to severe stenosis of the left M1.  The CTs were obtained within 6 hours of onset of the headache.  I believe her to be low risk for subarachnoid hemorrhage.  She was given a headache cocktail in triage, and reports  feeling somewhat improved.  She is no longer in distress.  She moves her head and neck without difficulty.  She states that she still feels nauseated when she stands up.  Her neurologic exam is otherwise reassuring.  Case discussed with Dr. Bebe ShaggyWickline, who recommends consultation with neurology.  Appreciate recommendations and consultation from Dr. Amada JupiterKirkpatrick from neurology.  We will check MRA to assess for the stenosis.  If artifact on CT, then will treat patient for migraine.  If real stenosis, plan is to call back Dr. Amada JupiterKirkpatrick, so he can talk  with the patient more about RCVS (reversible cerebral vasoconstriction syndrome) prior to discharge home.  6:54 AM MRI negative.  CT finding was artifactual.  Patient feels improved.  Ambulatory referral to neurology.     Final Clinical Impressions(s) / ED Diagnoses   Final diagnoses:  Acute nonintractable headache, unspecified headache type    ED Discharge Orders    None         Roxy HorsemanBrowning, Bufford Helms, PA-C 05/27/19 [id number]    Zadie RhineWickline, Donald, MD 05/27/19 [phone]

## 2019-05-27 NOTE — ED Notes (Signed)
Pt verbalized understanding of discharge paperwork and follow-up care.  °

## 2019-05-27 NOTE — Consult Note (Addendum)
Neurology Consultation Reason for Consult: Headache Referring Physician: Christy Gentles, D  CC: Headache  History is obtained from: Patient  HPI: Kylie Huerta is a 28 y.o. female with a history of headaches as a child as well as early adulthood consisting of severe photophobic headaches lasting for several hours.  These have improved and she does not typically get headaches on a regular basis at this point.  Tonight, she awoke with a discomfort towards the occipital region of her head which then progressively intensified over the course of about 10 minutes.  She states that the pain radiates forwards and was severe, 10/10 severity.  Since arrival, she has been treated with Reglan with significant improvement, though she still has headache and nausea whenever she moves.  She does take Zoloft as antidepressant.  Of note she does have significant stressors given that her wife just had a baby and is currently in the NICU and they have not been sleeping.  ROS: A 14 point ROS was performed and is negative except as noted in the HPI.   Past medical history Headaches as described above   Family History  Problem Relation Age of Onset  . Kidney failure Father   . Other Mother        brain tumor  . Kidney disease Mother      Social History:  reports that she has never smoked. She has never used smokeless tobacco. She reports that she does not drink alcohol or use drugs.   Exam: Current vital signs: BP (!) 189/111 (BP Location: Left Arm)   Pulse 66   Temp 97.9 F (36.6 C) (Oral)   Resp 20   SpO2 98%  Vital signs in last 24 hours: Temp:  [97.6 F (36.4 C)-98.3 F (36.8 C)] 97.9 F (36.6 C) (08/19 0217) Pulse Rate:  [65-88] 66 (08/19 0217) Resp:  [15-20] 20 (08/19 0217) BP: (103-189)/(57-111) 189/111 (08/19 0217) SpO2:  [98 %-100 %] 98 % (08/19 0217)   Physical Exam  Constitutional: Appears well-developed and well-nourished.  Psych: Affect appropriate to situation Eyes: No  scleral injection HENT: No OP obstrucion Head: Normocephalic.  Cardiovascular: Normal rate and regular rhythm.  Respiratory: Effort normal, non-labored breathing GI: Soft.  No distension. There is no tenderness.  Skin: WDI  Neuro: Mental Status: Patient is awake, alert, oriented to person, place, month, year, and situation. Patient is able to give a clear and coherent history. No signs of aphasia or neglect Cranial Nerves: II: Visual Fields are full. Pupils are equal, round, and reactive to light.   III,IV, VI: EOMI without ptosis or diploplia.  V: Facial sensation is symmetric to temperature VII: Facial movement is symmetric.  VIII: hearing is intact to voice X: Uvula elevates symmetrically XI: Shoulder shrug is symmetric. XII: tongue is midline without atrophy or fasciculations.  Motor: Tone is normal. Bulk is normal. 5/5 strength was present in all four extremities.  Sensory: Sensation is symmetric to light touch and temperature in the arms and legs. Cerebellar: FNF and HKS are intact bilaterally    I have reviewed labs in epic and the results pertinent to this consultation are: Mild hypokalemia at 3.1  I have reviewed the images obtained: CT/CTA- there is a short segment of stenotic appearing vessel in the left MCA which unfortunately occurs in the distribution of streak artifact  Impression: 28 year old female with a history of headaches consistent with migraine presents with severe headache associated with nausea and vomiting photophobia.  The rapid crescendo and abnormal imaging  do raise the possibility of reversible cerebral vasoconstriction syndrome, but I think there is a possibility that this is due to streak artifact.  I would favor further evaluation with MR angiogram which would not be subject to streak artifact in this region and if this does not demonstrate stenosis then I would treat this as migraine.  If it does demonstrate stenosis, then I would treat this as  reversible cerebral vasoconstriction syndrome, which is supportive care and discontinuation of Zoloft.  Recommendations: 1) MR angiogram head 2) Depacon 1g, MG 2g for headache  Ritta SlotMcNeill Antonietta Lansdowne, MD [facility] [phone]  If 7pm- 7am, please page neurology on call as listed in AMION.

## 2019-05-27 NOTE — ED Provider Notes (Signed)
Patient seen/examined in the Emergency Department in conjunction with Advanced Practice Provider [facility] Patient reports headache, now improving. Exam : Awake alert, no acute distress.  No focal weakness noted Plan: Recommend neurology consultation due to abnormal CT angiogram.    Ripley Fraise, MD 05/27/19 [phone]

## 2019-05-27 NOTE — ED Notes (Signed)
Patient transported to CT 

## 2021-05-08 DIAGNOSIS — F4310 Post-traumatic stress disorder, unspecified: Secondary | ICD-10-CM | POA: Insufficient documentation

## 2021-06-06 LAB — HM HIV SCREENING LAB: HM HIV Screening: NEGATIVE

## 2021-06-06 LAB — HM PAP SMEAR: Chlamydia, Swab/Urine, PCR: NEGATIVE

## 2021-11-26 ENCOUNTER — Encounter: Payer: Self-pay | Admitting: Emergency Medicine

## 2021-11-26 ENCOUNTER — Other Ambulatory Visit: Payer: Self-pay

## 2021-11-26 ENCOUNTER — Emergency Department: Payer: Medicaid Other

## 2021-11-26 ENCOUNTER — Observation Stay
Admission: EM | Admit: 2021-11-26 | Discharge: 2021-11-28 | Disposition: A | Discharge status: Home / Self Care | Payer: Medicaid Other | Attending: Internal Medicine | Admitting: Internal Medicine

## 2021-11-26 DIAGNOSIS — J45909 Unspecified asthma, uncomplicated: Secondary | ICD-10-CM

## 2021-11-26 DIAGNOSIS — R030 Elevated blood-pressure reading, without diagnosis of hypertension: Secondary | ICD-10-CM

## 2021-11-26 DIAGNOSIS — R0602 Shortness of breath: Secondary | ICD-10-CM | POA: Diagnosis present

## 2021-11-26 DIAGNOSIS — Z79899 Other long term (current) drug therapy: Secondary | ICD-10-CM | POA: Insufficient documentation

## 2021-11-26 DIAGNOSIS — J168 Pneumonia due to other specified infectious organisms: Principal | ICD-10-CM | POA: Insufficient documentation

## 2021-11-26 DIAGNOSIS — I10 Essential (primary) hypertension: Secondary | ICD-10-CM | POA: Diagnosis not present

## 2021-11-26 DIAGNOSIS — Z20822 Contact with and (suspected) exposure to covid-19: Secondary | ICD-10-CM | POA: Insufficient documentation

## 2021-11-26 DIAGNOSIS — F419 Anxiety disorder, unspecified: Secondary | ICD-10-CM | POA: Insufficient documentation

## 2021-11-26 DIAGNOSIS — J189 Pneumonia, unspecified organism: Secondary | ICD-10-CM

## 2021-11-26 DIAGNOSIS — F32A Depression, unspecified: Secondary | ICD-10-CM | POA: Diagnosis not present

## 2021-11-26 LAB — CBC
HCT: 39.6 % (ref 36.0–46.0)
Hemoglobin: 12.7 g/dL (ref 12.0–15.0)
MCH: 26.6 pg (ref 26.0–34.0)
MCHC: 32.1 g/dL (ref 30.0–36.0)
MCV: 82.8 fL (ref 80.0–100.0)
Platelets: 249 10*3/uL (ref 150–400)
RBC: 4.78 MIL/uL (ref 3.87–5.11)
RDW: 12.7 % (ref 11.5–15.5)
WBC: 8.4 10*3/uL (ref 4.0–10.5)
nRBC: 0 % (ref 0.0–0.2)

## 2021-11-26 LAB — BASIC METABOLIC PANEL
Anion gap: 11 (ref 5–15)
BUN: 6 mg/dL (ref 6–20)
CO2: 22 mmol/L (ref 22–32)
Calcium: 9.3 mg/dL (ref 8.9–10.3)
Chloride: 104 mmol/L (ref 98–111)
Creatinine, Ser: 0.71 mg/dL (ref 0.44–1.00)
GFR, Estimated: >60 mL/min (ref >60)
Glucose, Bld: 86 mg/dL (ref 70–99)
Potassium: 3.6 mmol/L (ref 3.5–5.1)
Sodium: 137 mmol/L (ref 135–145)

## 2021-11-26 LAB — RESP PANEL BY RT-PCR (FLU A&B, COVID) ARPGX2
Influenza A by PCR: NEGATIVE
Influenza B by PCR: NEGATIVE
SARS Coronavirus 2 by RT PCR: NEGATIVE

## 2021-11-26 LAB — TROPONIN I (HIGH SENSITIVITY): Troponin I (High Sensitivity): 3 ng/L (ref <18)

## 2021-11-26 MED ORDER — IPRATROPIUM-ALBUTEROL 0.5-2.5 (3) MG/3ML IN SOLN
3.0000 mL | Freq: Once | RESPIRATORY_TRACT | Status: AC
  Administered 2021-11-26: 3 mL via RESPIRATORY_TRACT
  Filled 2021-11-26: qty 3

## 2021-11-26 NOTE — ED Triage Notes (Addendum)
Pt to ED via POV with c/o throat pain that goes into her chest down to her diaphragm for the last two days. She has a cough and states that she has seen blood in her sputum. She gets [facility] and feels like she is going to pass out when she ambulates

## 2021-11-27 ENCOUNTER — Emergency Department: Payer: Medicaid Other

## 2021-11-27 DIAGNOSIS — F32A Depression, unspecified: Secondary | ICD-10-CM

## 2021-11-27 DIAGNOSIS — F419 Anxiety disorder, unspecified: Secondary | ICD-10-CM | POA: Diagnosis not present

## 2021-11-27 DIAGNOSIS — J189 Pneumonia, unspecified organism: Secondary | ICD-10-CM | POA: Diagnosis not present

## 2021-11-27 DIAGNOSIS — R030 Elevated blood-pressure reading, without diagnosis of hypertension: Secondary | ICD-10-CM

## 2021-11-27 LAB — HIV ANTIBODY (ROUTINE TESTING W REFLEX): HIV Screen 4th Generation wRfx: NONREACTIVE

## 2021-11-27 LAB — D-DIMER, QUANTITATIVE: D-Dimer, Quant: 0.58 ug/mL-FEU — ABNORMAL HIGH (ref 0.00–0.50)

## 2021-11-27 LAB — TROPONIN I (HIGH SENSITIVITY): Troponin I (High Sensitivity): 5 ng/L (ref <18)

## 2021-11-27 LAB — HCG, QUANTITATIVE, PREGNANCY: hCG, Beta Chain, Quant, S: <1 m[IU]/mL (ref <5)

## 2021-11-27 MED ORDER — PREDNISONE 20 MG PO TABS
40.0000 mg | ORAL_TABLET | Freq: Every day | ORAL | Status: DC
  Administered 2021-11-28: 40 mg via ORAL
  Filled 2021-11-27: qty 2

## 2021-11-27 MED ORDER — GUAIFENESIN-DM 100-10 MG/5ML PO SYRP
10.0000 mL | ORAL_SOLUTION | ORAL | Status: DC | PRN
  Administered 2021-11-27: 23:00:00 10 mL via ORAL
  Filled 2021-11-27: qty 10

## 2021-11-27 MED ORDER — IOHEXOL 350 MG/ML SOLN
75.0000 mL | Freq: Once | INTRAVENOUS | Status: AC | PRN
  Administered 2021-11-27: 75 mL via INTRAVENOUS

## 2021-11-27 MED ORDER — ACETAMINOPHEN 500 MG PO TABS
1000.0000 mg | ORAL_TABLET | Freq: Once | ORAL | Status: AC
  Administered 2021-11-27: 1000 mg via ORAL
  Filled 2021-11-27: qty 2

## 2021-11-27 MED ORDER — IPRATROPIUM-ALBUTEROL 0.5-2.5 (3) MG/3ML IN SOLN
3.0000 mL | Freq: Once | RESPIRATORY_TRACT | Status: AC
Start: 2021-11-27 — End: 2021-11-27
  Administered 2021-11-27: 3 mL via RESPIRATORY_TRACT
  Filled 2021-11-27: qty 3

## 2021-11-27 MED ORDER — LORATADINE 10 MG PO TABS
10.0000 mg | ORAL_TABLET | Freq: Every day | ORAL | Status: DC
Start: 2021-11-27 — End: 2021-11-28
  Administered 2021-11-27 – 2021-11-28 (×2): 10 mg via ORAL
  Filled 2021-11-27 (×2): qty 1

## 2021-11-27 MED ORDER — SERTRALINE HCL 50 MG PO TABS
75.0000 mg | ORAL_TABLET | Freq: Every day | ORAL | Status: DC
  Administered 2021-11-27 – 2021-11-28 (×2): 75 mg via ORAL
  Filled 2021-11-27 (×2): qty 2

## 2021-11-27 MED ORDER — ALBUTEROL SULFATE (2.5 MG/3ML) 0.083% IN NEBU
2.5000 mg | INHALATION_SOLUTION | RESPIRATORY_TRACT | Status: DC | PRN
  Administered 2021-11-27 (×2): 2.5 mg via RESPIRATORY_TRACT
  Filled 2021-11-27 (×2): qty 3

## 2021-11-27 MED ORDER — METHYLPREDNISOLONE SODIUM SUCC 125 MG IJ SOLR
125.0000 mg | Freq: Once | INTRAMUSCULAR | Status: AC
Start: 2021-11-27 — End: 2021-11-27
  Administered 2021-11-27: 125 mg via INTRAVENOUS
  Filled 2021-11-27: qty 2

## 2021-11-27 MED ORDER — SODIUM CHLORIDE 0.9 % IV SOLN
2.0000 g | Freq: Once | INTRAVENOUS | Status: AC
  Administered 2021-11-27: 2 g via INTRAVENOUS
  Filled 2021-11-27: qty 20

## 2021-11-27 MED ORDER — ACETAMINOPHEN 325 MG PO TABS: 650.0000 mg | ORAL_TABLET | Freq: Four times a day (QID) | ORAL | Status: DC | PRN

## 2021-11-27 MED ORDER — TRAZODONE HCL 50 MG PO TABS
25.0000 mg | ORAL_TABLET | Freq: Every day | ORAL | Status: DC
  Administered 2021-11-27: 25 mg via ORAL
  Filled 2021-11-27: qty 1

## 2021-11-27 MED ORDER — AZITHROMYCIN 500 MG IV SOLR
500.0000 mg | Freq: Once | INTRAVENOUS | Status: AC
  Administered 2021-11-27: 500 mg via INTRAVENOUS
  Filled 2021-11-27: qty 5

## 2021-11-27 MED ORDER — KETOROLAC TROMETHAMINE 30 MG/ML IJ SOLN
30.0000 mg | Freq: Once | INTRAMUSCULAR | Status: AC
  Administered 2021-11-27: 30 mg via INTRAVENOUS
  Filled 2021-11-27: qty 1

## 2021-11-27 MED ORDER — HYDROCHLOROTHIAZIDE 12.5 MG PO TABS
12.5000 mg | ORAL_TABLET | Freq: Every day | ORAL | Status: DC
  Administered 2021-11-27 – 2021-11-28 (×2): 12.5 mg via ORAL
  Filled 2021-11-27 (×2): qty 1

## 2021-11-27 MED ORDER — BUSPIRONE HCL 10 MG PO TABS
10.0000 mg | ORAL_TABLET | Freq: Two times a day (BID) | ORAL | Status: DC
  Administered 2021-11-27 – 2021-11-28 (×3): 10 mg via ORAL
  Filled 2021-11-27 (×2): qty 1
  Filled 2021-11-27: qty 2

## 2021-11-27 MED ORDER — ACETAMINOPHEN 650 MG RE SUPP: 650.0000 mg | Freq: Four times a day (QID) | RECTAL | Status: DC | PRN

## 2021-11-27 MED ORDER — SODIUM CHLORIDE 0.9 % IV SOLN: INTRAVENOUS | Status: DC

## 2021-11-27 MED ORDER — ALPRAZOLAM 0.5 MG PO TABS: 0.5000 mg | ORAL_TABLET | Freq: Every day | ORAL | Status: DC | PRN

## 2021-11-27 MED ORDER — ONDANSETRON HCL 4 MG/2ML IJ SOLN: 4.0000 mg | Freq: Four times a day (QID) | INTRAMUSCULAR | Status: DC | PRN

## 2021-11-27 MED ORDER — ENOXAPARIN SODIUM 60 MG/0.6ML IJ SOSY
0.5000 mg/kg | PREFILLED_SYRINGE | INTRAMUSCULAR | Status: DC
  Administered 2021-11-27 – 2021-11-28 (×2): 52.5 mg via SUBCUTANEOUS
  Filled 2021-11-27 (×2): qty 0.6

## 2021-11-27 MED ORDER — SODIUM CHLORIDE 0.9 % IV SOLN
2.0000 g | INTRAVENOUS | Status: DC
  Administered 2021-11-28: 2 g via INTRAVENOUS
  Filled 2021-11-27: qty 2
  Filled 2021-11-27: qty 20

## 2021-11-27 MED ORDER — SODIUM CHLORIDE 0.9 % IV BOLUS (SEPSIS)
1000.0000 mL | Freq: Once | INTRAVENOUS | Status: AC
  Administered 2021-11-27: 1000 mL via INTRAVENOUS

## 2021-11-27 MED ORDER — SODIUM CHLORIDE 0.9 % IV SOLN
500.0000 mg | INTRAVENOUS | Status: DC
  Administered 2021-11-28: 500 mg via INTRAVENOUS
  Filled 2021-11-27: qty 500

## 2021-11-27 MED ORDER — ONDANSETRON HCL 4 MG PO TABS: 4.0000 mg | ORAL_TABLET | Freq: Four times a day (QID) | ORAL | Status: DC | PRN

## 2021-11-27 NOTE — ED Notes (Signed)
ED Provider at bedside. 

## 2021-11-27 NOTE — Assessment & Plan Note (Signed)
Continue BuSpar and Zoloft 

## 2021-11-27 NOTE — ED Notes (Signed)
Pt resting comfortably in bed at this time. Pt is alert and oriented with even and regular respirations. No acute distress noted. Pt denies any needs at this time. Call light within reach.  °

## 2021-11-27 NOTE — ED Notes (Signed)
Pt ambulated around room with pulse oximetry, SpO2 maintained at 98. Pt increasingly tachypneic with accessory muscle use. Pt tripoding in bed, placed on 2L for comfort. Dr. Elesa Massed made aware, see mar for details

## 2021-11-27 NOTE — ED Notes (Signed)
Pt given cup of water 

## 2021-11-27 NOTE — Assessment & Plan Note (Deleted)
Blood pressure quite high at times.  Could be secondary to difficulty breathing.  Will change diet to cardiac diet.  Discontinue IV fluids.  Start low-dose hydrochlorothiazide.

## 2021-11-27 NOTE — Progress Notes (Signed)
°  Progress Note   Patient: Kylie Huerta CBU:[id number] DOB: July 19, 1991 DOA: 11/26/2021     0 DOS: the patient was seen and examined on 11/27/2021    Assessment and Plan: * Pneumonia of right lower lobe due to infectious organism Continue Rocephin, Zithromax.  Patient received 1 dose of Solu-Medrol in the emergency room.  We will continue with prednisone tomorrow.  As needed nebulizers.  Anxiety and depression Continue BuSpar and Zoloft.  Elevated blood pressure reading without diagnosis of hypertension Blood pressure quite high at times.  Could be secondary to difficulty breathing.  Will change diet to cardiac diet.  Discontinue IV fluids.  Start low-dose hydrochlorothiazide.        Subjective: Patient feeling a little bit better than when she came in.  Still has some chest congestion.  Initially had wheezing.  Diagnosed with pneumonia.  Physical Exam: Vitals:   11/27/21 0202 11/27/21 0430 11/27/21 0545 11/27/21 0928  BP: (!) 152/102 (!) 138/98 (!) 136/120 (!) 162/101  Pulse: 98 90 78 91  Resp: (!) 25 (!) 24 (!) 22 16  Temp:      TempSrc:      SpO2: 100% 99% 97% 97%  Weight:      Height:       Physical Exam HENT:     Head: Normocephalic.     Mouth/Throat:     Pharynx: No oropharyngeal exudate.  Eyes:     General: Lids are normal.     Conjunctiva/sclera: Conjunctivae normal.     Pupils: Pupils are equal, round, and reactive to light.  Cardiovascular:     Rate and Rhythm: Normal rate and regular rhythm.     Heart sounds: Normal heart sounds, S1 normal and S2 normal.  Pulmonary:     Breath sounds: Examination of the right-lower field reveals decreased breath sounds and rhonchi. Examination of the left-lower field reveals decreased breath sounds and rhonchi. Decreased breath sounds and rhonchi present. No wheezing or rales.  Abdominal:     Palpations: Abdomen is soft.     Tenderness: There is no abdominal tenderness.  Musculoskeletal:     Right lower leg: No swelling.      Left lower leg: No swelling.  Skin:    General: Skin is warm.     Findings: No rash.  Neurological:     Mental Status: She is alert and oriented to person, place, and time.     Data Reviewed: Laboratory and radiological data reviewed from this hospital stay.  Family Communication: Declined  Disposition: Status is: Observation The patient remains OBS appropriate and will d/c before 2 midnights.  Hoping on reevaluation tomorrow she will be well enough to go home.  Planned Discharge Destination: Home   Author: Alford Highland, MD 11/27/2021 10:57 AM  For on call review [email or web address].

## 2021-11-27 NOTE — Assessment & Plan Note (Addendum)
The patient received 2 doses of IV Rocephin and Zithromax here in the hospital will go on 3 more doses of Zithromax and 7 more doses of Omnicef upon discharge.  We will also prescribe prednisone taper.  Inhaler was also prescribed for asthma.

## 2021-11-27 NOTE — H&P (Signed)
History and Physical    Patient: Kylie Huerta UEA:[id number] DOB: [birth year] DOA: 11/26/2021 DOS: the patient was seen and examined on 11/27/2021 PCP: Porfirio Oar, PA  Patient coming from: Home  Chief Complaint:  Chief Complaint  Patient presents with   Shortness of Breath   Chest Pain    HPI: Kylie Huerta is a 31 y.o. female with no significant past medical history who presents to the ED with a 2-day history of fever and chills, productive cough of yellow-brown sputum with streaks of blood, sore throat, chest soreness on coughing and wheezing and shortness of breath feeling like she is going to pass out.  She denies lower extremity pain or swelling.  ED course: On arrival febrile to 99.8, pulse 95-98, respirations 24, BP 151/108 with O2 sat 96% on room air Blood work: CBC, BMP within normal limits, troponin 3-5, D-dimer 0.58 COVID and flu negative  EKG, personally viewed and interpreted: Normal sinus rhythm at 94 with no acute ST-T wave changes Imaging: CTA chest with no evidence of PE.  Mild to moderate severe patchy right lower lobe infiltrate  Patient was started on Rocephin and azithromycin and given a treatment with DuoNeb.  With ambulation in the ED she reportedly became very tachypneic to the 30s.  Hospitalization requested.  Data reviewed: Relevant notes from primary care and specialist visits, past discharge summaries available in EHR, including Care Everywhere. Prior diagnostic testing as pertinent to current admission diagnoses Updated medications and problem lists for reconciliation ED course, including vitals, labs, imaging, treatment and response to treatment Triage notes and ED providers notes Notable results as noted in HPI   Review of Systems: As mentioned in the history of present illness. All other systems reviewed and are negative. History reviewed. No pertinent past medical history. Past Surgical History:  Procedure Laterality Date   CESAREAN  SECTION     Social History:  reports that she has never smoked. She has never used smokeless tobacco. She reports that she does not drink alcohol and does not use drugs.  Allergies  Allergen Reactions   Almond Oil Shortness Of Breath and Swelling   Other Shortness Of Breath and Swelling    Walnuts.    Kiwi Extract Hives    Family History  Problem Relation Age of Onset   Kidney failure Father    Other Mother        brain tumor   Kidney disease Mother     Prior to Admission medications   Medication Sig Start Date End Date Taking? Authorizing Provider  naproxen (NAPROSYN) 500 MG tablet Take 1 tablet (500 mg total) by mouth 2 (two) times daily. 06/14/16   Palumbo, April, MD    Physical Exam: Vitals:   11/26/21 2136 11/26/21 2349 11/27/21 0202  BP: (!) 151/108 (!) 160/92 (!) 152/102  Pulse: 95 96 98  Resp: (!) 24 18 (!) 25  Temp: 99.8 F (37.7 C)    TempSrc: Oral    SpO2: 96% 100% 100%  Weight: 104.3 kg    Height: 5\' 8"  (1.727 m)     Physical Exam Vitals and nursing note reviewed.  Constitutional:      General: She is not in acute distress.    Appearance: Normal appearance.  HENT:     Head: Normocephalic and atraumatic.  Cardiovascular:     Rate and Rhythm: Normal rate and regular rhythm.     Pulses: Normal pulses.     Heart sounds: Normal heart sounds. No murmur heard.  Pulmonary:     Effort: Tachypnea present.     Breath sounds: Examination of the right-lower field reveals wheezing and rales. Wheezing and rales present. No rhonchi.     Comments: Tachypneic with minimal exertion, increased work of breathing Abdominal:     General: Bowel sounds are normal.     Palpations: Abdomen is soft.     Tenderness: There is no abdominal tenderness.  Musculoskeletal:        General: No swelling or tenderness. Normal range of motion.     Cervical back: Normal range of motion and neck supple.  Skin:    General: Skin is warm and dry.  Neurological:     General: No focal  deficit present.     Mental Status: She is alert. Mental status is at baseline.  Psychiatric:        Mood and Affect: Mood normal.        Behavior: Behavior normal.       Assessment and Plan: * Pneumonia of right lower lobe due to infectious organism IV Rocephin and azithromycin DuoNebs as needed Antitussives IV fluids, supportive care Supplemental O2 if needed Follow blood cultures       Advance Care Planning:   Code Status: Not on file full  Consults: none  Family Communication: none  Severity of Illness: The appropriate patient status for this patient is OBSERVATION. Observation status is judged to be reasonable and necessary in order to provide the required intensity of service to ensure the patient's safety. The patient's presenting symptoms, physical exam findings, and initial radiographic and laboratory data in the context of their medical condition is felt to place them at decreased risk for further clinical deterioration. Furthermore, it is anticipated that the patient will be medically stable for discharge from the hospital within 2 midnights of admission.   Author: Andris Baumann, MD 11/27/2021 2:41 AM  For on call review [email or web address].

## 2021-11-27 NOTE — Progress Notes (Signed)
Anticoagulation monitoring(Lovenox):  31 yo female ordered Lovenox 40 mg Q24h    Filed Weights   11/26/21 2136  Weight: 104.3 kg (230 lb)   BMI  34.97   Lab Results  Component Value Date   CREATININE 0.71 11/26/2021   CREATININE 0.90 05/27/2019   CREATININE 1.00 05/26/2019   Estimated Creatinine Clearance: 130 mL/min (by C-G formula based on SCr of 0.71 mg/dL). Hemoglobin & Hematocrit     Component Value Date/Time   HGB 12.7 11/26/2021 2146   HCT 39.6 11/26/2021 2146     Per Protocol for Patient with estCrcl > 30 ml/min and BMI > 30, will transition to Lovenox 52.5 mg Q24h.

## 2021-11-27 NOTE — ED Provider Notes (Signed)
[facility] Provider Note    Event Date/Time   First MD Initiated Contact with Patient 11/27/21 0002     (approximate)   History   Shortness of Breath and Chest Pain   HPI  Kylie Huerta is a 31 y.o. female with no significant past medical history who presents to the emergency department with 2 days of fevers, chills, productive cough that is yellow/brown with streaks of blood today.  She has had chest soreness and shortness of breath and feels like she might pass out with exertion.  No history of PE or DVT.  No lower extremity swelling or pain.  No recent travel or sick contacts.  No history of asthma or COPD.  Does not wear oxygen chronically.   History provided by patient.    History reviewed. No pertinent past medical history.  Past Surgical History:  Procedure Laterality Date   CESAREAN SECTION      MEDICATIONS:  Prior to Admission medications   Medication Sig Start Date End Date Taking? Authorizing Provider  naproxen (NAPROSYN) 500 MG tablet Take 1 tablet (500 mg total) by mouth 2 (two) times daily. 06/14/16   Palumbo, April, MD    Physical Exam   Triage Vital Signs: ED Triage Vitals [11/26/21 2136]  Enc Vitals Group     BP (!) 151/108     Pulse Rate 95     Resp (!) 24     Temp 99.8 F (37.7 C)     Temp Source Oral     SpO2 96 %     Weight 230 lb (104.3 kg)     Height 5\' 8"  (1.727 m)     Head Circumference      Peak Flow      Pain Score 10     Pain Loc      Pain Edu?      Excl. in GC?     Most recent vital signs: Vitals:   11/26/21 2349 11/27/21 0202  BP: (!) 160/92 (!) 152/102  Pulse: 96 98  Resp: 18 (!) 25  Temp:    SpO2: 100% 100%    CONSTITUTIONAL: Alert and oriented and responds appropriately to questions.  Appears uncomfortable HEAD: Normocephalic, atraumatic EYES: Conjunctivae clear, pupils appear equal, sclera nonicteric ENT: normal nose; moist mucous membranes NECK: Supple, normal ROM CARD: Regular and  tachycardic; S1 and S2 appreciated; no murmurs, no clicks, no rubs, no gallops RESP: Patient is tachypneic.  She has increased work of breathing and is speaking in truncated sentences.  She has minimal expiratory scattered wheezes appreciated but no rhonchi or rales.  She is not hypoxic on room air at rest. ABD/GI: Normal bowel sounds; non-distended; soft, non-tender, no rebound, no guarding, no peritoneal signs BACK: The back appears normal EXT: Normal ROM in all joints; no deformity noted, no edema; no cyanosis, no calf tenderness or calf swelling SKIN: Normal color for age and race; warm; no rash on exposed skin NEURO: Moves all extremities equally, normal speech PSYCH: The patient's mood and manner are appropriate.   ED Results / Procedures / Treatments   LABS: (all labs ordered are listed, but only abnormal results are displayed) Labs Reviewed  D-DIMER, QUANTITATIVE - Abnormal; Notable for the following components:      Result Value   D-Dimer, Quant 0.58 (*)    All other components within normal limits  RESP PANEL BY RT-PCR (FLU A&B, COVID) ARPGX2  CULTURE, BLOOD (ROUTINE X 2)  CULTURE, BLOOD (ROUTINE  X 2)  BASIC METABOLIC PANEL  CBC  HCG, QUANTITATIVE, PREGNANCY  TROPONIN I (HIGH SENSITIVITY)  TROPONIN I (HIGH SENSITIVITY)     EKG:  EKG Interpretation  Date/Time:  Sunday November 26 2021 21:29:39 EST Ventricular Rate:  94 PR Interval:  172 QRS Duration: 68 QT Interval:  334 QTC Calculation: 417 R Axis:   53 Text Interpretation: Normal sinus rhythm Normal ECG When compared with ECG of 28-Apr-2014 15:47, PREVIOUS ECG IS PRESENT Confirmed by Rochele Raring [id number] on 11/27/2021 1:09:52 AM         RADIOLOGY: My personal review and interpretation of imaging: Chest x-ray shows acute bronchitis without infiltrate but CT of the chest shows a right lower lobe pneumonia and no PE.  I have personally reviewed all radiology reports.   DG Chest 2 View  Result Date:  11/26/2021 CLINICAL DATA:  Chest pain and shortness of breath EXAM: CHEST - 2 VIEW COMPARISON:  None. FINDINGS: Heart and mediastinal shadows are normal. There is bronchial thickening but no infiltrate, collapse or effusion. Mild spinal curvature convex towards the right. IMPRESSION: Bronchitis pattern.  No consolidation or collapse. Electronically Signed   By: Paulina Fusi M.D.   On: 11/26/2021 22:07   CT Angio Chest PE W and/or Wo Contrast  Result Date: 11/27/2021 CLINICAL DATA:  Chest pain. EXAM: CT ANGIOGRAPHY CHEST WITH CONTRAST TECHNIQUE: Multidetector CT imaging of the chest was performed using the standard protocol during bolus administration of intravenous contrast. Multiplanar CT image reconstructions and MIPs were obtained to evaluate the vascular anatomy. RADIATION DOSE REDUCTION: This exam was performed according to the departmental dose-optimization program which includes automated exposure control, adjustment of the mA and/or kV according to patient size and/or use of iterative reconstruction technique. CONTRAST:  56mL OMNIPAQUE IOHEXOL 350 MG/ML SOLN COMPARISON:  None. FINDINGS: Cardiovascular: Satisfactory opacification of the pulmonary arteries to the segmental level. No evidence of pulmonary embolism. Normal heart size. No pericardial effusion. Mediastinum/Nodes: There is mild right hilar lymphadenopathy. Thyroid gland, trachea, and esophagus demonstrate no significant findings. Lungs/Pleura: There is mild to moderate severity patchy right lower lobe infiltrate. There is no evidence of a pleural effusion or pneumothorax. Upper Abdomen: No acute abnormality. Musculoskeletal: No chest wall abnormality. No acute or significant osseous findings. Review of the MIP images confirms the above findings. IMPRESSION: 1. No evidence of pulmonary embolism. 2. Mild to moderate severity patchy right lower lobe infiltrate. 3. Mild right hilar lymphadenopathy, likely reactive. Electronically Signed   By:  Aram Candela M.D.   On: 11/27/2021 01:07     PROCEDURES:  Critical Care performed: Yes, see critical care procedure note(s)   CRITICAL CARE Performed by: Baxter Hire Kylen Ismael   Total critical care time: 45 minutes  Critical care time was exclusive of separately billable procedures and treating other patients.  Critical care was necessary to treat or prevent imminent or life-threatening deterioration.  Critical care was time spent personally by me on the following activities: development of treatment plan with patient and/or surrogate as well as nursing, discussions with consultants, evaluation of patient's response to treatment, examination of patient, obtaining history from patient or surrogate, ordering and performing treatments and interventions, ordering and review of laboratory studies, ordering and review of radiographic studies, pulse oximetry and re-evaluation of patient's condition.   Marland Kitchen1-3 Lead EKG Interpretation Performed by: Naveyah Iacovelli, Layla Maw, DO Authorized by: Acacia Latorre, Layla Maw, DO     Interpretation: abnormal     ECG rate:  110   ECG rate assessment: tachycardic  Rhythm: sinus tachycardia     Ectopy: none     Conduction: normal      IMPRESSION / MDM / ASSESSMENT AND PLAN / ED COURSE  I reviewed the triage vital signs and the nursing notes.    Patient here with chest pain, shortness of breath, fevers and cough.  The patient is on the cardiac monitor to evaluate for evidence of arrhythmia and/or significant heart rate changes.   DIFFERENTIAL DIAGNOSIS (includes but not limited to):   Pneumonia, COVID-19, influenza, other viral URI, PE, bronchospasm, bronchitis, less likely ACS, dissection, CHF   PLAN: We will obtain CBC, BMP, troponin, D-dimer, chest x-ray.  Will give IV fluids, Toradol, DuoNeb, Solu-Medrol.   MEDICATIONS GIVEN IN ED: Medications  azithromycin (ZITHROMAX) 500 mg in sodium chloride 0.9 % 250 mL IVPB (500 mg Intravenous New Bag/Given 11/27/21  0241)  ipratropium-albuterol (DUONEB) 0.5-2.5 (3) MG/3ML nebulizer solution 3 mL (3 mLs Nebulization Given 11/26/21 2147)  sodium chloride 0.9 % bolus 1,000 mL (1,000 mLs Intravenous New Bag/Given 11/27/21 0026)  acetaminophen (TYLENOL) tablet 1,000 mg (1,000 mg Oral Given 11/27/21 0028)  ipratropium-albuterol (DUONEB) 0.5-2.5 (3) MG/3ML nebulizer solution 3 mL (3 mLs Nebulization Given 11/27/21 0015)  methylPREDNISolone sodium succinate (SOLU-MEDROL) 125 mg/2 mL injection 125 mg (125 mg Intravenous Given 11/27/21 0025)  ketorolac (TORADOL) 30 MG/ML injection 30 mg (30 mg Intravenous Given 11/27/21 0027)  iohexol (OMNIPAQUE) 350 MG/ML injection 75 mL (75 mLs Intravenous Contrast Given 11/27/21 0055)  cefTRIAXone (ROCEPHIN) 2 g in sodium chloride 0.9 % 100 mL IVPB (0 g Intravenous Stopped 11/27/21 0216)  ipratropium-albuterol (DUONEB) 0.5-2.5 (3) MG/3ML nebulizer solution 3 mL (3 mLs Nebulization Given 11/27/21 0236)     ED COURSE: Patient's labs show no leukocytosis.  Troponin x2 negative.  Normal renal function and electrolytes.  D-dimer elevated.  We will proceed with CTA of the chest.  Chest x-ray reviewed by myself and radiologist shows pattern consistent with bronchitis but no infiltrate or edema.  She is COVID and flu negative.   1:30 AM Pt's CT scan reviewed by myself and radiologist and shows no PE but does show right lower lobe infiltrate.  Will give Rocephin and azithromycin here.  Her work of breathing seems to have improved and she is satting 100% on room air.  We will ambulate to see how she does and how her oxygen is to determine disposition.  2:30 AM  Pt is not hypoxic with ambulation becomes extremely tachypneic with wheezing.  Will give another breathing treatment.  I feel she will need admission due to increased work of breathing.  Will discuss with hospitalist.   CONSULTS:  Consulted and discussed patient's case with hospitalist, Dr. Para March.  I have recommended admission and consulting  physician agrees and will place admission orders.  Patient (and family if present) agree with this plan.   I reviewed all nursing notes, vitals, pertinent previous records.  All labs, EKGs, imaging ordered have been independently reviewed and interpreted by myself.    OUTSIDE RECORDS REVIEWED: Reviewed patient's last primary care doctor visit on 06/06/2021 with [facility].         FINAL CLINICAL IMPRESSION(S) / ED DIAGNOSES   Final diagnoses:  Pneumonia of right lower lobe due to infectious organism     Rx / DC Orders   ED Discharge Orders     None        Note:  This document was prepared using Dragon voice recognition software and may include unintentional dictation errors.  Meliss Fleek, Layla MawKristen N, DO 11/27/21 [phone]

## 2021-11-28 DIAGNOSIS — J189 Pneumonia, unspecified organism: Secondary | ICD-10-CM | POA: Diagnosis not present

## 2021-11-28 DIAGNOSIS — F419 Anxiety disorder, unspecified: Secondary | ICD-10-CM | POA: Diagnosis not present

## 2021-11-28 DIAGNOSIS — J45909 Unspecified asthma, uncomplicated: Secondary | ICD-10-CM

## 2021-11-28 DIAGNOSIS — I10 Essential (primary) hypertension: Secondary | ICD-10-CM

## 2021-11-28 MED ORDER — DULERA 100-5 MCG/ACT IN AERO: 1.0000 | INHALATION_SPRAY | Freq: Two times a day (BID) | RESPIRATORY_TRACT | 0 refills | Status: DC

## 2021-11-28 MED ORDER — HYDROCHLOROTHIAZIDE 12.5 MG PO TABS: 12.5000 mg | ORAL_TABLET | Freq: Every day | ORAL | 0 refills | Status: DC

## 2021-11-28 MED ORDER — CEFDINIR 300 MG PO CAPS: 300.0000 mg | ORAL_CAPSULE | Freq: Two times a day (BID) | ORAL | 0 refills | Status: AC

## 2021-11-28 MED ORDER — PREDNISONE 20 MG PO TABS
40.0000 mg | ORAL_TABLET | Freq: Every day | ORAL | 0 refills | Status: AC
Start: 2021-11-29 — End: 2021-12-02

## 2021-11-28 MED ORDER — BENZONATATE 100 MG PO CAPS
200.0000 mg | ORAL_CAPSULE | Freq: Three times a day (TID) | ORAL | Status: DC | PRN
  Administered 2021-11-28: 02:00:00 200 mg via ORAL
  Filled 2021-11-28 (×2): qty 2

## 2021-11-28 MED ORDER — AZITHROMYCIN 250 MG PO TABS: ORAL_TABLET | ORAL | 0 refills | Status: DC

## 2021-11-28 MED ORDER — ALBUTEROL SULFATE HFA 108 (90 BASE) MCG/ACT IN AERS: 2.0000 | INHALATION_SPRAY | Freq: Four times a day (QID) | RESPIRATORY_TRACT | 0 refills | Status: AC | PRN

## 2021-11-28 MED ORDER — BENZONATATE 200 MG PO CAPS: 200.0000 mg | ORAL_CAPSULE | Freq: Three times a day (TID) | ORAL | 0 refills | Status: DC | PRN

## 2021-11-28 NOTE — Discharge Summary (Signed)
Physician Discharge Summary   Patient: Kylie Huerta MRN: [id number] DOB: [birth year]  Admit date:     11/26/2021  Discharge date: 11/28/21  Discharge Physician: Alford Highland   PCP: Theora Gianotti PA  Recommendations at discharge:   Follow-up PCP 5 days  Discharge Diagnoses: Principal Problem:   Pneumonia of right lower lobe due to infectious organism Active Problems:   Anxiety and depression   Essential hypertension    Hospital Course: Patient was brought in as an observation on 11/27/2021 and was feeling better on 11/28/2021.  Admitted with right lower lobe pneumonia.  The patient was started on Rocephin and Zithromax.  Patient also received Solu-Medrol in the emergency room.  We will prescribe prednisone taper Zithromax for 3 more days and Omnicef for 7 more doses upon discharge.  Assessment and Plan: * Pneumonia of right lower lobe due to infectious organism The patient received 2 doses of IV Rocephin and Zithromax here in the hospital will go on 3 more doses of Zithromax and 7 more doses of Omnicef upon discharge.  We will also prescribe prednisone taper.  Inhaler was also prescribed for asthma.  Essential hypertension I prescribed low-dose hydrochlorothiazide.  Recommend checking a BMP and follow-up appointment.  Anxiety and depression Continue BuSpar and Zoloft.           Consultants: None Procedures performed: None Disposition: Home Diet recommendation:  Cardiac diet  DISCHARGE MEDICATION: Allergies as of 11/28/2021       Reactions   Almond Oil Shortness Of Breath, Swelling   Other Shortness Of Breath, Swelling   Walnuts.    Kiwi Extract Hives        Medication List     TAKE these medications    albuterol 108 (90 Base) MCG/ACT inhaler Commonly known as: VENTOLIN HFA Inhale 2 puffs into the lungs every 6 (six) hours as needed for wheezing or shortness of breath.   ALPRAZolam 0.5 MG tablet Commonly known as: XANAX Take 0.25-0.5 mg by mouth  as needed for anxiety.   azithromycin 250 MG tablet Commonly known as: Zithromax One tab po nightly for three days   benzonatate 200 MG capsule Commonly known as: TESSALON Take 1 capsule (200 mg total) by mouth 3 (three) times daily as needed (cough refractory to robitussin).   busPIRone 10 MG tablet Commonly known as: BUSPAR Take 10 mg by mouth 2 (two) times daily.   cefdinir 300 MG capsule Commonly known as: OMNICEF Take 1 capsule (300 mg total) by mouth 2 (two) times daily for 7 doses.   cetirizine 10 MG tablet Commonly known as: ZYRTEC Take 10 mg by mouth daily.   Dulera 100-5 MCG/ACT Aero Generic drug: mometasone-formoterol Inhale 1 puff into the lungs 2 (two) times daily.   hydrochlorothiazide 12.5 MG tablet Commonly known as: HYDRODIURIL Take 1 tablet (12.5 mg total) by mouth daily.   predniSONE 20 MG tablet Commonly known as: DELTASONE Take 2 tablets (40 mg total) by mouth daily with breakfast for 3 days. Start taking on: November 29, 2021   sertraline 50 MG tablet Commonly known as: ZOLOFT Take 75 mg by mouth daily. This dose starts after 2 weeks of a 50 mg dose daily.   traZODone 50 MG tablet Commonly known as: DESYREL Take 25 mg by mouth at bedtime.        Follow-up Information     Leotis Shames [place], [place]. Go on 12/04/2021.   Specialty: Family Medicine Why: @ 10AM Contact information: [address]  [place] [id number] [phone]                 Discharge Exam: Ceasar MonsFiled Weights   11/26/21 2136  Weight: 104.3 kg   Physical Exam HENT:     Head: Normocephalic.     Mouth/Throat:     Pharynx: No oropharyngeal exudate.  Eyes:     General: Lids are normal.     Conjunctiva/sclera: Conjunctivae normal.     Pupils: Pupils are equal, round, and reactive to light.  Cardiovascular:     Rate and Rhythm: Normal rate and regular rhythm.     Heart sounds: Normal heart sounds, S1 normal and S2 normal.  Pulmonary:     Breath  sounds: Examination of the right-lower field reveals decreased breath sounds and rhonchi. Examination of the left-lower field reveals decreased breath sounds and rhonchi. Decreased breath sounds and rhonchi present. No wheezing or rales.  Abdominal:     Palpations: Abdomen is soft.     Tenderness: There is no abdominal tenderness.  Musculoskeletal:     Right lower leg: No swelling.     Left lower leg: No swelling.  Skin:    General: Skin is warm.     Findings: No rash.  Neurological:     Mental Status: She is alert and oriented to person, place, and time.     Condition at discharge: stable  The results of significant diagnostics from this hospitalization (including imaging, microbiology, ancillary and laboratory) are listed below for reference.   Imaging Studies: DG Chest 2 View  Result Date: 11/26/2021 CLINICAL DATA:  Chest pain and shortness of breath EXAM: CHEST - 2 VIEW COMPARISON:  None. FINDINGS: Heart and mediastinal shadows are normal. There is bronchial thickening but no infiltrate, collapse or effusion. Mild spinal curvature convex towards the right. IMPRESSION: Bronchitis pattern.  No consolidation or collapse. Electronically Signed   By: Paulina FusiMark  Shogry M.D.   On: 11/26/2021 22:07   CT Angio Chest PE W and/or Wo Contrast  Result Date: 11/27/2021 CLINICAL DATA:  Chest pain. EXAM: CT ANGIOGRAPHY CHEST WITH CONTRAST TECHNIQUE: Multidetector CT imaging of the chest was performed using the standard protocol during bolus administration of intravenous contrast. Multiplanar CT image reconstructions and MIPs were obtained to evaluate the vascular anatomy. RADIATION DOSE REDUCTION: This exam was performed according to the departmental dose-optimization program which includes automated exposure control, adjustment of the mA and/or kV according to patient size and/or use of iterative reconstruction technique. CONTRAST:  75mL OMNIPAQUE IOHEXOL 350 MG/ML SOLN COMPARISON:  None. FINDINGS:  Cardiovascular: Satisfactory opacification of the pulmonary arteries to the segmental level. No evidence of pulmonary embolism. Normal heart size. No pericardial effusion. Mediastinum/Nodes: There is mild right hilar lymphadenopathy. Thyroid gland, trachea, and esophagus demonstrate no significant findings. Lungs/Pleura: There is mild to moderate severity patchy right lower lobe infiltrate. There is no evidence of a pleural effusion or pneumothorax. Upper Abdomen: No acute abnormality. Musculoskeletal: No chest wall abnormality. No acute or significant osseous findings. Review of the MIP images confirms the above findings. IMPRESSION: 1. No evidence of pulmonary embolism. 2. Mild to moderate severity patchy right lower lobe infiltrate. 3. Mild right hilar lymphadenopathy, likely reactive. Electronically Signed   By: Aram Candelahaddeus  Houston M.D.   On: 11/27/2021 01:07    Microbiology: Results for orders placed or performed during the hospital encounter of 11/26/21  Resp Panel by RT-PCR (Flu A&B, Covid) Nasopharyngeal Swab     Status: None   Collection Time: 11/26/21  9:46 PM  Specimen: Nasopharyngeal Swab; Nasopharyngeal(NP) swabs in vial transport medium  Result Value Ref Range Status   SARS Coronavirus 2 by RT PCR NEGATIVE NEGATIVE Final    Comment: (NOTE) SARS-CoV-2 target nucleic acids are NOT DETECTED.  The SARS-CoV-2 RNA is generally detectable in upper respiratory specimens during the acute phase of infection. The lowest concentration of SARS-CoV-2 viral copies this assay can detect is 138 copies/mL. A negative result does not preclude SARS-Cov-2 infection and should not be used as the sole basis for treatment or other patient management decisions. A negative result may occur with  improper specimen collection/handling, submission of specimen other than nasopharyngeal swab, presence of viral mutation(s) within the areas targeted by this assay, and inadequate number of viral copies(<138  copies/mL). A negative result must be combined with clinical observations, patient history, and epidemiological information. The expected result is Negative.  Fact Sheet for Patients:  BloggerCourse.com  Fact Sheet for Healthcare Providers:  SeriousBroker.it  This test is no t yet approved or cleared by the [place] FDA and  has been authorized for detection and/or diagnosis of SARS-CoV-2 by FDA under an Emergency Use Authorization (EUA). This EUA will remain  in effect (meaning this test can be used) for the duration of the COVID-19 declaration under Section 564(b)(1) of the Act, 21 U.S.C.section 360bbb-3(b)(1), unless the authorization is terminated  or revoked sooner.       Influenza A by PCR NEGATIVE NEGATIVE Final   Influenza B by PCR NEGATIVE NEGATIVE Final    Comment: (NOTE) The Xpert Xpress SARS-CoV-2/FLU/RSV plus assay is intended as an aid in the diagnosis of influenza from Nasopharyngeal swab specimens and should not be used as a sole basis for treatment. Nasal washings and aspirates are unacceptable for Xpert Xpress SARS-CoV-2/FLU/RSV testing.  Fact Sheet for Patients: BloggerCourse.com  Fact Sheet for Healthcare Providers: SeriousBroker.it  This test is not yet approved or cleared by the [place] FDA and has been authorized for detection and/or diagnosis of SARS-CoV-2 by FDA under an Emergency Use Authorization (EUA). This EUA will remain in effect (meaning this test can be used) for the duration of the COVID-19 declaration under Section 564(b)(1) of the Act, 21 U.S.C. section 360bbb-3(b)(1), unless the authorization is terminated or revoked.  Performed at [facility], [address] [place], [place] [address]   Culture, blood (Routine X 2) w Reflex to ID Panel     Status: None (Preliminary result)   Collection Time: 11/27/21  1:32 AM    Specimen: BLOOD  Result Value Ref Range Status   Specimen Description BLOOD RIGHT ANTECUBITAL  Final   Special Requests   Final    BOTTLES DRAWN AEROBIC AND ANAEROBIC Blood Culture adequate volume   Culture   Final    NO GROWTH 1 DAY Performed at [facility], [address], [place], [place] [address]    Report Status PENDING  Incomplete  Culture, blood (Routine X 2) w Reflex to ID Panel     Status: None (Preliminary result)   Collection Time: 11/27/21  5:39 PM   Specimen: BLOOD  Result Value Ref Range Status   Specimen Description BLOOD BLOOD RIGHT HAND  Final   Special Requests   Final    BOTTLES DRAWN AEROBIC AND ANAEROBIC Blood Culture adequate volume   Culture   Final    NO GROWTH < 12 HOURS Performed at [facility], [address] [place], [place] [address]    Report Status PENDING  Incomplete  Labs: CBC: Recent Labs  Lab 11/26/21 2146  WBC 8.4  HGB 12.7  HCT 39.6  MCV 82.8  PLT 249   Basic Metabolic Panel: Recent Labs  Lab 11/26/21 2146  NA 137  K 3.6  CL 104  CO2 22  GLUCOSE 86  BUN 6  CREATININE 0.71  CALCIUM 9.3     Discharge time spent: greater than 30 minutes.  Signed: Alford Highland, MD [facility] 11/28/2021

## 2021-11-28 NOTE — Assessment & Plan Note (Signed)
I prescribed low-dose hydrochlorothiazide.  Recommend checking a BMP and follow-up appointment.

## 2021-12-02 LAB — CULTURE, BLOOD (ROUTINE X 2)
Culture: NO GROWTH
Culture: NO GROWTH
Special Requests: ADEQUATE
Special Requests: ADEQUATE

## 2022-05-18 DIAGNOSIS — N62 Hypertrophy of breast: Secondary | ICD-10-CM

## 2022-05-18 HISTORY — DX: Hypertrophy of breast: N62

## 2022-08-24 ENCOUNTER — Ambulatory Visit (INDEPENDENT_AMBULATORY_CARE_PROVIDER_SITE_OTHER): Payer: Medicaid Other | Admitting: Psychiatry

## 2022-08-24 ENCOUNTER — Encounter: Payer: Self-pay | Admitting: Psychiatry

## 2022-08-24 VITALS — BP 143/93 | HR 71 | Temp 97.4°F | Wt 266.0 lb

## 2022-08-24 DIAGNOSIS — F39 Unspecified mood [affective] disorder: Secondary | ICD-10-CM | POA: Insufficient documentation

## 2022-08-24 DIAGNOSIS — F1421 Cocaine dependence, in remission: Secondary | ICD-10-CM | POA: Diagnosis not present

## 2022-08-24 DIAGNOSIS — F509 Eating disorder, unspecified: Secondary | ICD-10-CM | POA: Insufficient documentation

## 2022-08-24 DIAGNOSIS — F1911 Other psychoactive substance abuse, in remission: Secondary | ICD-10-CM

## 2022-08-24 DIAGNOSIS — F609 Personality disorder, unspecified: Secondary | ICD-10-CM

## 2022-08-24 DIAGNOSIS — F1220 Cannabis dependence, uncomplicated: Secondary | ICD-10-CM | POA: Diagnosis not present

## 2022-08-24 NOTE — Patient Instructions (Addendum)
[facility] [address] [place], [place], [place] [address]  [phone]  [facility] [address] Odie Sera [place], [place] [address]   [phone]  [facility] [address] [place], [place] [address]   [phone]   [facility] [address] Ton[place]n Fail [place], [place] [address]  [phone]  [email or web address] [address] Leonette Monarch [place], [place] [address]   [phone]
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
## 2022-09-04 DIAGNOSIS — E785 Hyperlipidemia, unspecified: Secondary | ICD-10-CM | POA: Insufficient documentation

## 2022-09-04 DIAGNOSIS — K219 Gastro-esophageal reflux disease without esophagitis: Secondary | ICD-10-CM | POA: Insufficient documentation

## 2022-09-04 DIAGNOSIS — J4540 Moderate persistent asthma, uncomplicated: Secondary | ICD-10-CM | POA: Insufficient documentation

## 2022-09-04 DIAGNOSIS — F339 Major depressive disorder, recurrent, unspecified: Secondary | ICD-10-CM | POA: Insufficient documentation

## 2023-03-26 ENCOUNTER — Emergency Department
Admission: EM | Admit: 2023-03-26 | Discharge: 2023-03-26 | Disposition: A | Discharge status: Home / Self Care | Payer: Managed Care, Other (non HMO) | Attending: Emergency Medicine | Admitting: Emergency Medicine

## 2023-03-26 ENCOUNTER — Other Ambulatory Visit: Payer: Self-pay

## 2023-03-26 DIAGNOSIS — Z1152 Encounter for screening for COVID-19: Secondary | ICD-10-CM | POA: Insufficient documentation

## 2023-03-26 DIAGNOSIS — R519 Headache, unspecified: Secondary | ICD-10-CM | POA: Diagnosis not present

## 2023-03-26 DIAGNOSIS — R0602 Shortness of breath: Secondary | ICD-10-CM | POA: Insufficient documentation

## 2023-03-26 DIAGNOSIS — R110 Nausea: Secondary | ICD-10-CM | POA: Insufficient documentation

## 2023-03-26 DIAGNOSIS — R0789 Other chest pain: Secondary | ICD-10-CM | POA: Insufficient documentation

## 2023-03-26 DIAGNOSIS — Z79899 Other long term (current) drug therapy: Secondary | ICD-10-CM | POA: Diagnosis not present

## 2023-03-26 DIAGNOSIS — I10 Essential (primary) hypertension: Secondary | ICD-10-CM

## 2023-03-26 LAB — CBC WITH DIFFERENTIAL/PLATELET
Abs Immature Granulocytes: 0.01 10*3/uL (ref 0.00–0.07)
Basophils Absolute: 0 10*3/uL (ref 0.0–0.1)
Basophils Relative: 0 %
Eosinophils Absolute: 0.1 10*3/uL (ref 0.0–0.5)
Eosinophils Relative: 2 %
HCT: 40.9 % (ref 36.0–46.0)
Hemoglobin: 13.5 g/dL (ref 12.0–15.0)
Immature Granulocytes: 0 %
Lymphocytes Relative: 28 %
Lymphs Abs: 2 10*3/uL (ref 0.7–4.0)
MCH: 27.3 pg (ref 26.0–34.0)
MCHC: 33 g/dL (ref 30.0–36.0)
MCV: 82.6 fL (ref 80.0–100.0)
Monocytes Absolute: 0.5 10*3/uL (ref 0.1–1.0)
Monocytes Relative: 7 %
Neutro Abs: 4.7 10*3/uL (ref 1.7–7.7)
Neutrophils Relative %: 63 %
Platelets: 261 10*3/uL (ref 150–400)
RBC: 4.95 MIL/uL (ref 3.87–5.11)
RDW: 12.7 % (ref 11.5–15.5)
WBC: 7.4 10*3/uL (ref 4.0–10.5)
nRBC: 0 % (ref 0.0–0.2)

## 2023-03-26 LAB — SARS CORONAVIRUS 2 BY RT PCR: SARS Coronavirus 2 by RT PCR: NEGATIVE

## 2023-03-26 LAB — COMPREHENSIVE METABOLIC PANEL
ALT: 11 U/L (ref 0–44)
AST: 17 U/L (ref 15–41)
Albumin: 4 g/dL (ref 3.5–5.0)
Alkaline Phosphatase: 61 U/L (ref 38–126)
Anion gap: 11 (ref 5–15)
BUN: 10 mg/dL (ref 6–20)
CO2: 18 mmol/L — ABNORMAL LOW (ref 22–32)
Calcium: 8.8 mg/dL — ABNORMAL LOW (ref 8.9–10.3)
Chloride: 104 mmol/L (ref 98–111)
Creatinine, Ser: 0.73 mg/dL (ref 0.44–1.00)
GFR, Estimated: >60 mL/min (ref >60)
Glucose, Bld: 94 mg/dL (ref 70–99)
Potassium: 3.5 mmol/L (ref 3.5–5.1)
Sodium: 133 mmol/L — ABNORMAL LOW (ref 135–145)
Total Bilirubin: 0.6 mg/dL (ref 0.3–1.2)
Total Protein: 7.6 g/dL (ref 6.5–8.1)

## 2023-03-26 LAB — MAGNESIUM: Magnesium: 1.9 mg/dL (ref 1.7–2.4)

## 2023-03-26 LAB — LIPASE, BLOOD: Lipase: 31 U/L (ref 11–51)

## 2023-03-26 LAB — TROPONIN I (HIGH SENSITIVITY)
Troponin I (High Sensitivity): 4 ng/L (ref <18)
Troponin I (High Sensitivity): 3 ng/L (ref <18)

## 2023-03-26 MED ORDER — LISINOPRIL 10 MG PO TABS: 10.0000 mg | ORAL_TABLET | Freq: Every day | ORAL | 1 refills | Status: DC

## 2023-03-26 MED ORDER — LISINOPRIL 10 MG PO TABS
10.0000 mg | ORAL_TABLET | Freq: Once | ORAL | Status: AC
  Administered 2023-03-26: 10 mg via ORAL
  Filled 2023-03-26: qty 1

## 2023-03-26 MED ORDER — LISINOPRIL 10 MG PO TABS: 10.0000 mg | ORAL_TABLET | Freq: Every day | ORAL | 2 refills | Status: DC

## 2023-03-26 NOTE — ED Triage Notes (Signed)
Brought in via medic with reports of elevated BP, chest tightness, sob, headache, and nausea x 1 day.

## 2023-03-26 NOTE — ED Provider Notes (Signed)
[facility] Provider Note    Event Date/Time   First MD Initiated Contact with Patient 03/26/23 [phone]     (approximate)   History   Hypertension (Brought in via medic with reports of elevated BP, chest tightness, sob, headache, and nausea x 1 day. )   HPI  Kylie Huerta is a 32 y.o. female who presents to the ED for evaluation of Hypertension (Brought in via medic with reports of elevated BP, chest tightness, sob, headache, and nausea x 1 day. )   I review a [facility] telemedicine PCP visit from July 2023.  Obese patient with history of HTN who was prescribed amlodipine and HCTZ at that time.  Patient presents to the ED for evaluation of hypertension.  Reports she had a headache earlier today and took some Advil and it was better.  Reports she was having trouble sleeping tonight, which is unusual for her, so she checked her blood pressure and it was elevated.  She reports developing chest tightness sensation whenever she checks her blood pressure.  She reports 2 elevated readings at home with systolics 170s-190s, and due to this they called EMS and she presents with her mother.   Physical Exam   Triage Vital Signs: ED Triage Vitals  Enc Vitals Group     BP 03/26/23 0352 (!) 182/109     Pulse Rate 03/26/23 0352 63     Resp 03/26/23 0352 17     Temp 03/26/23 0352 98.6 F (37 C)     Temp Source 03/26/23 0352 Oral     SpO2 03/26/23 0350 100 %     Weight 03/26/23 0356 270 lb (122.5 kg)     Height 03/26/23 0356 5\' 8"  (1.727 m)     Head Circumference --      Peak Flow --      Pain Score 03/26/23 0353 7     Pain Loc --      Pain Edu? --      Excl. in GC? --     Most recent vital signs: Vitals:   03/26/23 0530 03/26/23 0600  BP: (!) 155/95 (!) 169/96  Pulse: 66 74  Resp: 17   Temp:    SpO2: 100% 100%    General: Awake, no distress.  CV:  Good peripheral perfusion.  RRR without appreciable murmur Resp:  Normal effort.  Abd:  No distention.   MSK:  No deformity noted.  Neuro:  No focal deficits appreciated. Cranial nerves II through XII intact 5/5 strength and sensation in all 4 extremities Other:     ED Results / Procedures / Treatments   Labs (all labs ordered are listed, but only abnormal results are displayed) Labs Reviewed  COMPREHENSIVE METABOLIC PANEL - Abnormal; Notable for the following components:      Result Value   Sodium 133 (*)    CO2 18 (*)    Calcium 8.8 (*)    All other components within normal limits  SARS CORONAVIRUS 2 BY RT PCR  CBC WITH DIFFERENTIAL/PLATELET  MAGNESIUM  LIPASE, BLOOD  TROPONIN I (HIGH SENSITIVITY)  TROPONIN I (HIGH SENSITIVITY)    EKG Sinus rhythm with a rate of 66 bpm.  Normal axis and intervals.  No clear signs of acute ischemia.  RADIOLOGY   Official radiology report(s): No results found.  PROCEDURES and INTERVENTIONS:  .1-3 Lead EKG Interpretation  Performed by: Delton Prairie, MD Authorized by: Delton Prairie, MD     Interpretation: normal  ECG rate:  60   ECG rate assessment: normal     Rhythm: sinus rhythm     Ectopy: none     Conduction: normal     Medications  lisinopril (ZESTRIL) tablet 10 mg (10 mg Oral Given 03/26/23 0431)     IMPRESSION / MDM / ASSESSMENT AND PLAN / ED COURSE  I reviewed the triage vital signs and the nursing notes.  Differential diagnosis includes, but is not limited to, ACS, PTX, PNA, muscle strain/spasm, PE, dissection, anxiety, pleural effusion  {Patient presents with symptoms of an acute illness or injury that is potentially life-threatening.  Patient presents with poorly controlled hypertension without evidence of endorgan damage and suitable for outpatient management.  Looks systemically well.  Blood work is reassuring with normal CBC and metabolic panel with normal renal function.  2 negative troponins and BP slowly improving with starting lisinopril.  Discharged with 72-month prescription and referred patient back to her  PCP.   Clinical Course as of 03/26/23 0635  Tue Mar 26, 2023  0632 Reassessed.  Patient reports feeling well.  BP somewhat improving and she remains asymptomatic.  We discussed starting lisinopril and following up with her PCP. [DS]    Clinical Course User Index [DS] Delton Prairie, MD     FINAL CLINICAL IMPRESSION(S) / ED DIAGNOSES   Final diagnoses:  Uncontrolled hypertension     Rx / DC Orders   ED Discharge Orders          Ordered    lisinopril (ZESTRIL) 10 MG tablet  Daily,   Status:  Discontinued        03/26/23 0633    lisinopril (ZESTRIL) 10 MG tablet  Daily        03/26/23 1610             Note:  This document was prepared using Dragon voice recognition software and may include unintentional dictation errors.   Delton Prairie, MD 03/26/23 [phone]

## 2023-08-21 ENCOUNTER — Observation Stay
Admission: EM | Admit: 2023-08-21 | Discharge: 2023-08-23 | Disposition: A | Discharge status: Home / Self Care | Payer: Managed Care, Other (non HMO) | Attending: Emergency Medicine | Admitting: Emergency Medicine

## 2023-08-21 ENCOUNTER — Emergency Department: Payer: Managed Care, Other (non HMO)

## 2023-08-21 ENCOUNTER — Encounter: Payer: Self-pay | Admitting: Emergency Medicine

## 2023-08-21 ENCOUNTER — Other Ambulatory Visit: Payer: Self-pay

## 2023-08-21 DIAGNOSIS — R200 Anesthesia of skin: Secondary | ICD-10-CM | POA: Diagnosis not present

## 2023-08-21 DIAGNOSIS — Z6838 Body mass index (BMI) 38.0-38.9, adult: Secondary | ICD-10-CM | POA: Diagnosis not present

## 2023-08-21 DIAGNOSIS — G459 Transient cerebral ischemic attack, unspecified: Secondary | ICD-10-CM | POA: Diagnosis present

## 2023-08-21 DIAGNOSIS — R531 Weakness: Secondary | ICD-10-CM | POA: Diagnosis present

## 2023-08-21 DIAGNOSIS — I10 Essential (primary) hypertension: Secondary | ICD-10-CM | POA: Diagnosis not present

## 2023-08-21 DIAGNOSIS — Z79899 Other long term (current) drug therapy: Secondary | ICD-10-CM | POA: Insufficient documentation

## 2023-08-21 DIAGNOSIS — E785 Hyperlipidemia, unspecified: Secondary | ICD-10-CM | POA: Insufficient documentation

## 2023-08-21 DIAGNOSIS — R519 Headache, unspecified: Secondary | ICD-10-CM

## 2023-08-21 LAB — COMPREHENSIVE METABOLIC PANEL
ALT: 14 U/L (ref 0–44)
AST: 16 U/L (ref 15–41)
Albumin: 4 g/dL (ref 3.5–5.0)
Alkaline Phosphatase: 51 U/L (ref 38–126)
Anion gap: 9 (ref 5–15)
BUN: 14 mg/dL (ref 6–20)
CO2: 25 mmol/L (ref 22–32)
Calcium: 8.9 mg/dL (ref 8.9–10.3)
Chloride: 103 mmol/L (ref 98–111)
Creatinine, Ser: 0.78 mg/dL (ref 0.44–1.00)
GFR, Estimated: >60 mL/min (ref >60)
Glucose, Bld: 100 mg/dL — ABNORMAL HIGH (ref 70–99)
Potassium: 3.3 mmol/L — ABNORMAL LOW (ref 3.5–5.1)
Sodium: 137 mmol/L (ref 135–145)
Total Bilirubin: <0.2 mg/dL (ref <1.2)
Total Protein: 7.5 g/dL (ref 6.5–8.1)

## 2023-08-21 LAB — APTT: aPTT: 32 s (ref 24–36)

## 2023-08-21 LAB — HCG, QUANTITATIVE, PREGNANCY: hCG, Beta Chain, Quant, S: 1 m[IU]/mL (ref <5)

## 2023-08-21 LAB — CBC
HCT: 39.6 % (ref 36.0–46.0)
Hemoglobin: 12.8 g/dL (ref 12.0–15.0)
MCH: 27.6 pg (ref 26.0–34.0)
MCHC: 32.3 g/dL (ref 30.0–36.0)
MCV: 85.5 fL (ref 80.0–100.0)
Platelets: 272 10*3/uL (ref 150–400)
RBC: 4.63 MIL/uL (ref 3.87–5.11)
RDW: 13.2 % (ref 11.5–15.5)
WBC: 6.2 10*3/uL (ref 4.0–10.5)
nRBC: 0 % (ref 0.0–0.2)

## 2023-08-21 LAB — DIFFERENTIAL
Abs Immature Granulocytes: 0.01 10*3/uL (ref 0.00–0.07)
Basophils Absolute: 0 10*3/uL (ref 0.0–0.1)
Basophils Relative: 1 %
Eosinophils Absolute: 0.1 10*3/uL (ref 0.0–0.5)
Eosinophils Relative: 1 %
Immature Granulocytes: 0 %
Lymphocytes Relative: 25 %
Lymphs Abs: 1.6 10*3/uL (ref 0.7–4.0)
Monocytes Absolute: 0.5 10*3/uL (ref 0.1–1.0)
Monocytes Relative: 7 %
Neutro Abs: 4 10*3/uL (ref 1.7–7.7)
Neutrophils Relative %: 66 %

## 2023-08-21 LAB — ETHANOL: Alcohol, Ethyl (B): <10 mg/dL (ref <10)

## 2023-08-21 LAB — TROPONIN I (HIGH SENSITIVITY)
Troponin I (High Sensitivity): 4 ng/L (ref <18)
Troponin I (High Sensitivity): 5 ng/L (ref <18)

## 2023-08-21 LAB — PROTIME-INR
INR: 1 (ref 0.8–1.2)
Prothrombin Time: 13.6 s (ref 11.4–15.2)

## 2023-08-21 MED ORDER — SODIUM CHLORIDE 0.9% FLUSH
3.0000 mL | Freq: Once | INTRAVENOUS | Status: AC
  Administered 2023-08-21: 3 mL via INTRAVENOUS

## 2023-08-21 MED ORDER — IOHEXOL 350 MG/ML SOLN
75.0000 mL | Freq: Once | INTRAVENOUS | Status: AC | PRN
  Administered 2023-08-21: 75 mL via INTRAVENOUS

## 2023-08-21 NOTE — ED Provider Notes (Signed)
[facility] Provider Note    Event Date/Time   First MD Initiated Contact with Patient 08/21/23 2135     (approximate)   History   Numbness   HPI  Kylie Huerta is a 32 y.o. female past medical history significant for hypertension presents to the emergency department with left hemibody numbness.  Endorses 1 month of left-sided hemibody numbness.  States that tongue, left side of the face and left arm feels numb.  Intermittently feels like her left arm "goes dead".  Also endorsing dizziness.  Denies any dizziness at this time.  No double vision or change in vision.  Denies any falls or head trauma.  Out of home antihypertensive medications of HCTZ for the past 2 days.  Denies any chest pain or shortness of breath.  Denies concern for pregnancy.  Denies any falls or head trauma.     Physical Exam   Triage Vital Signs: ED Triage Vitals  Encounter Vitals Group     BP 08/21/23 1813 (!) 169/104     Systolic BP Percentile --      Diastolic BP Percentile --      Pulse Rate 08/21/23 1813 60     Resp 08/21/23 1813 18     Temp 08/21/23 1813 98.9 F (37.2 C)     Temp Source 08/21/23 1813 Oral     SpO2 08/21/23 1813 97 %     Weight 08/21/23 1814 253 lb (114.8 kg)     Height 08/21/23 1814 5\' 8"  (1.727 m)     Head Circumference --      Peak Flow --      Pain Score 08/21/23 1814 7     Pain Loc --      Pain Education --      Exclude from Growth Chart --     Most recent vital signs: Vitals:   08/21/23 2235 08/21/23 2238  BP: (!) 156/107   Pulse: 70   Resp: (!) 25   Temp:  98.4 F (36.9 C)  SpO2: 100%     Physical Exam Constitutional:      Appearance: She is well-developed.  HENT:     Head: Atraumatic.  Eyes:     Conjunctiva/sclera: Conjunctivae normal.  Cardiovascular:     Rate and Rhythm: Regular rhythm.  Pulmonary:     Effort: No respiratory distress.  Abdominal:     General: There is no distension.  Musculoskeletal:        General:  Normal range of motion.     Cervical back: Normal range of motion.  Skin:    General: Skin is warm.  Neurological:     Mental Status: She is alert. Mental status is at baseline.     GCS: GCS eye subscore is 4. GCS verbal subscore is 5. GCS motor subscore is 6.     Cranial Nerves: Cranial nerves 2-12 are intact.     Sensory: Sensory deficit present.     Motor: Motor function is intact.     Coordination: Coordination is intact.     Gait: Gait is intact.     Comments: No nystagmus.  No visual field deficit.  Decree sensation to the left face and upper extremity.  Intact and equal sensation to bilateral lower extremities     IMPRESSION / MDM / ASSESSMENT AND PLAN / ED COURSE  I reviewed the triage vital signs and the nursing notes.  Differential diagnosis including dissection, CVA, electrolyte abnormality  EKG  I,  Corena Herter, the attending physician, personally viewed and interpreted this ECG.   Rate: Normal  Rhythm: Normal sinus  Axis: Normal  Intervals: Normal  ST&T Change: None   RADIOLOGY I independently reviewed imaging, my interpretation of imaging: CT scan of the head without signs of intracranial hemorrhage or infarction.  CTA head and neck pending  MRI pending  LABS (all labs ordered are listed, but only abnormal results are displayed) Labs interpreted as -    Labs Reviewed  COMPREHENSIVE METABOLIC PANEL - Abnormal; Notable for the following components:      Result Value   Potassium 3.3 (*)    Glucose, Bld 100 (*)    All other components within normal limits  PROTIME-INR  APTT  CBC  DIFFERENTIAL  ETHANOL  HCG, QUANTITATIVE, PREGNANCY  POC URINE PREG, ED  TROPONIN I (HIGH SENSITIVITY)  TROPONIN I (HIGH SENSITIVITY)     MDM    CT scan of the head without signs of intracranial hemorrhage or infarction.  Patient is outside of the window to rule out subarachnoid hemorrhage.  Given her focal deficits of left-sided numbness plan for CTA to rule out  dissection and MRI to evaluate for acute CVA.   PROCEDURES:  Critical Care performed: No  Procedures  Patient's presentation is most consistent with acute presentation with potential threat to life or bodily function.   MEDICATIONS ORDERED IN ED: Medications  sodium chloride flush (NS) 0.9 % injection 3 mL (3 mLs Intravenous Given 08/21/23 2248)  iohexol (OMNIPAQUE) 350 MG/ML injection 75 mL (75 mLs Intravenous Contrast Given 08/21/23 2250)    FINAL CLINICAL IMPRESSION(S) / ED DIAGNOSES   Final diagnoses:  Numbness     Rx / DC Orders   ED Discharge Orders     None        Note:  This document was prepared using Dragon voice recognition software and may include unintentional dictation errors.   Corena Herter, MD 08/21/23 [phone]

## 2023-08-21 NOTE — ED Triage Notes (Signed)
Patient to ED via POV for intermittent numbness x1 month. Currently left arm, lips, nose and left side of face feels numb. Pt also states for the past 2 weeks she has intermittently lost function of her left hand- currently has function. Also had slurred speech- none at this time.    Also having left-sided CP. Started around 4pm. Denies cardiac problems.

## 2023-08-22 ENCOUNTER — Observation Stay: Payer: Managed Care, Other (non HMO)

## 2023-08-22 ENCOUNTER — Observation Stay
Admit: 2023-08-22 | Discharge: 2023-08-22 | Disposition: A | Discharge status: Home / Self Care | Payer: Managed Care, Other (non HMO) | Attending: Internal Medicine | Admitting: Internal Medicine

## 2023-08-22 ENCOUNTER — Encounter: Payer: Self-pay | Admitting: Internal Medicine

## 2023-08-22 DIAGNOSIS — G459 Transient cerebral ischemic attack, unspecified: Secondary | ICD-10-CM | POA: Diagnosis present

## 2023-08-22 DIAGNOSIS — R531 Weakness: Secondary | ICD-10-CM | POA: Diagnosis not present

## 2023-08-22 LAB — HEMOGLOBIN A1C
Hgb A1c MFr Bld: 5.4 % (ref 4.8–5.6)
Mean Plasma Glucose: 108.28 mg/dL

## 2023-08-22 LAB — HIV ANTIBODY (ROUTINE TESTING W REFLEX): HIV Screen 4th Generation wRfx: NONREACTIVE

## 2023-08-22 LAB — PROTEIN AND GLUCOSE, CSF
Glucose, CSF: 47 mg/dL (ref 40–70)
Total  Protein, CSF: 17 mg/dL (ref 15–45)

## 2023-08-22 MED ORDER — ENOXAPARIN SODIUM 60 MG/0.6ML IJ SOSY
55.0000 mg | PREFILLED_SYRINGE | INTRAMUSCULAR | Status: DC
  Administered 2023-08-22: 55 mg via SUBCUTANEOUS
  Filled 2023-08-22 (×2): qty 0.6

## 2023-08-22 MED ORDER — LISINOPRIL 10 MG PO TABS: 10.0000 mg | ORAL_TABLET | Freq: Every day | ORAL | Status: DC

## 2023-08-22 MED ORDER — SODIUM CHLORIDE 0.9% FLUSH
10.0000 mL | Freq: Two times a day (BID) | INTRAVENOUS | Status: DC
  Administered 2023-08-22 – 2023-08-23 (×3): 10 mL via INTRAVENOUS

## 2023-08-22 MED ORDER — POTASSIUM CHLORIDE CRYS ER 20 MEQ PO TBCR
40.0000 meq | EXTENDED_RELEASE_TABLET | Freq: Once | ORAL | Status: AC
  Administered 2023-08-22: 40 meq via ORAL
  Filled 2023-08-22 (×2): qty 2

## 2023-08-22 MED ORDER — ACETAMINOPHEN 160 MG/5ML PO SOLN: 650.0000 mg | ORAL | Status: DC | PRN

## 2023-08-22 MED ORDER — HYDROCHLOROTHIAZIDE 12.5 MG PO TABS
12.5000 mg | ORAL_TABLET | Freq: Every day | ORAL | Status: DC
  Administered 2023-08-22 – 2023-08-23 (×2): 12.5 mg via ORAL
  Filled 2023-08-22 (×3): qty 1

## 2023-08-22 MED ORDER — LIDOCAINE 1 % OPTIME INJ - NO CHARGE
5.0000 mL | Freq: Once | INTRAMUSCULAR | Status: AC
  Administered 2023-08-22: 5 mL via SUBCUTANEOUS

## 2023-08-22 MED ORDER — ACETAMINOPHEN 650 MG RE SUPP: 650.0000 mg | RECTAL | Status: DC | PRN

## 2023-08-22 MED ORDER — STROKE: EARLY STAGES OF RECOVERY BOOK: Freq: Once | Status: AC

## 2023-08-22 MED ORDER — ACETAMINOPHEN 325 MG PO TABS
650.0000 mg | ORAL_TABLET | ORAL | Status: DC | PRN
  Administered 2023-08-22 (×2): 650 mg via ORAL
  Filled 2023-08-22 (×3): qty 2

## 2023-08-22 MED ORDER — ALBUTEROL SULFATE (2.5 MG/3ML) 0.083% IN NEBU: 2.5000 mg | INHALATION_SOLUTION | Freq: Four times a day (QID) | RESPIRATORY_TRACT | Status: DC | PRN

## 2023-08-22 MED ORDER — SENNOSIDES-DOCUSATE SODIUM 8.6-50 MG PO TABS: 1.0000 | ORAL_TABLET | Freq: Every evening | ORAL | Status: DC | PRN

## 2023-08-22 MED ORDER — GADOBUTROL 1 MMOL/ML IV SOLN
10.0000 mL | Freq: Once | INTRAVENOUS | Status: AC | PRN
  Administered 2023-08-22: 10 mL via INTRAVENOUS

## 2023-08-22 MED ORDER — LORATADINE 10 MG PO TABS
10.0000 mg | ORAL_TABLET | Freq: Every day | ORAL | Status: DC
  Administered 2023-08-23: 10 mg via ORAL
  Filled 2023-08-22: qty 1

## 2023-08-22 MED ORDER — LISINOPRIL 20 MG PO TABS
20.0000 mg | ORAL_TABLET | Freq: Every day | ORAL | Status: DC
  Administered 2023-08-22 – 2023-08-23 (×2): 20 mg via ORAL
  Filled 2023-08-22: qty 1
  Filled 2023-08-22 (×2): qty 2

## 2023-08-22 MED ORDER — AMLODIPINE BESYLATE 5 MG PO TABS
5.0000 mg | ORAL_TABLET | Freq: Every day | ORAL | Status: DC
  Administered 2023-08-22: 5 mg via ORAL
  Filled 2023-08-22 (×3): qty 1

## 2023-08-22 NOTE — ED Notes (Signed)
Back from b/r, steady gait. MD at [facility], pt alert, NAD, calm, interactive, resps e/u, speaking clearly.

## 2023-08-22 NOTE — ED Notes (Signed)
Pt given beverage and graham crackers at this time.

## 2023-08-22 NOTE — ED Notes (Signed)
Transport put in

## 2023-08-22 NOTE — ED Provider Notes (Signed)
Assumed care of patient at shift change.  Patient here for intermittent left-sided facial numbness, arm numbness and arm weakness progressively worsening over the past month.  Also having intermittent headaches and left sided vision changes that she describes as blurry vision.  MRI of the brain without contrast showed partial empty sella which could be seen in idiopathic intracranial hypertension.  She has no history of the same.  Otherwise no acute abnormality.  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  We did discuss the case with neurology on-call.  They recommend MRV of the brain, MRI of the brain with contrast and admission for lumbar puncture under fluoroscopy.  Will discuss with hospitalist for admission.  Patient comfortable with this plan.   Alonda Weaber, Layla Maw, DO 08/22/23 [phone]

## 2023-08-22 NOTE — H&P (Signed)
History and Physical    Kylie Huerta:[id number] DOB: Sep 11, 1991 DOA: 08/21/2023  PCP: [facility] (Confirm with patient/family/NH records and if not entered, this has to be entered at [facility] point of entry) Patient coming from: Home  I have personally briefly reviewed patient's old medical records in [facility]  Chief Complaint: Left sided weakness, vision problem  HPI: Kylie Huerta is a 32 y.o. female with medical history significant of HTN, anxiety/depression, presented with persistent left-sided weakness, vision changes.  Symptoms started 3-4 weeks ago, patient started having episode of numbness initially left side face and then extended to left shoulder and left arm, no numbness weakness of lower extremities.  She has HTN and checks her BP in the evening and reported that her BP has been poorly controlled with SBP more than 200 in the mornings, she takes HCTZ/lisinopril only.  Last few days, she started to have episode of blurry vision of left eye and she found that blurry vision happens frequently in the morning when rising up, lasted for 20 to 30 minutes.  She denied any headache, no nauseous vomiting.  ED Course: Afebrile, nontachycardic blood pressure 170/100, nonhypoxic.  CT head negative for acute findings, CTA negative for large vessel occlusion, brain MRI showed no acute intracranial abnormalities but partially empty sella implying for idiopathic intracranial hypertension. MRV and MRI with contrast both negative.  Blood work showed K3.3, normal CBC.  Review of Systems: As per HPI otherwise 14 point review of systems negative.    Past Medical History:  Diagnosis Date   Anxiety    Depression    Hypertension     Past Surgical History:  Procedure Laterality Date   CESAREAN SECTION       reports that she has never smoked. She has never been exposed to tobacco smoke. She has never used smokeless tobacco. She reports that she does not  currently use alcohol. She reports current drug use. Drugs: Marijuana and Cocaine.  Allergies  Allergen Reactions   Almond Oil Shortness Of Breath and Swelling   Tree Extract Anaphylaxis and Swelling   Kiwi Extract Hives   Pollen Extract     Family History  Problem Relation Age of Onset   Drug abuse Mother    Bipolar disorder Mother    Other Mother        brain tumor   Kidney disease Mother    Kidney failure Father    Drug abuse Brother    Drug abuse Brother    Drug abuse Brother    Bipolar disorder Maternal Aunt    Bipolar disorder Maternal Grandmother    Bipolar disorder Paternal Grandmother    Suicidality Cousin      Prior to Admission medications   Medication Sig Start Date End Date Taking? Authorizing Provider  albuterol (VENTOLIN HFA) 108 (90 Base) MCG/ACT inhaler Inhale 2 puffs into the lungs every 6 (six) hours as needed for wheezing or shortness of breath. 11/28/21  Yes Wieting, Richard, MD  busPIRone (BUSPAR) 10 MG tablet Take 10 mg by mouth 2 (two) times daily. Patient not taking: Reported on 08/22/2023 11/22/21   [provider]  cetirizine (ZYRTEC) 10 MG tablet Take 10 mg by mouth daily. 11/17/21   [provider]  EPINEPHrine 0.3 mg/0.3 mL IJ SOAJ injection Inject into the muscle. 01/05/20   [provider]  fluconazole (DIFLUCAN) 150 MG tablet Take 150 mg by mouth every 3 (three) days. Patient not taking: Reported on  08/22/2023 08/06/23   [provider]  hydrochlorothiazide (HYDRODIURIL) 12.5 MG tablet Take 1 tablet (12.5 mg total) by mouth daily. Patient not taking: Reported on 08/22/2023 11/28/21   Alford Highland, MD  lisinopril (ZESTRIL) 10 MG tablet Take 1 tablet (10 mg total) by mouth daily. Patient not taking: Reported on 08/22/2023 03/26/23 03/25/24  Delton Prairie, MD  metroNIDAZOLE (FLAGYL) 500 MG tablet Take 500 mg by mouth 2 (two) times daily. X 7 days Patient not taking: Reported on 08/22/2023 08/06/23   [provider]  mometasone-formoterol (DULERA) 100-5 MCG/ACT AERO one puff 2 (two) times daily. Patient not taking: Reported on 08/22/2023 11/28/21   [provider]  sertraline (ZOLOFT) 50 MG tablet Take 75 mg by mouth daily. This dose starts after 2 weeks of a 50 mg dose daily. Patient not taking: Reported on 08/22/2023 05/08/21   [provider]  triamcinolone cream (KENALOG) 0.1 % Apply topically. Patient not taking: Reported on 08/22/2023 01/22/22   [provider]    Physical Exam: Vitals:   08/22/23 0630 08/22/23 0700 08/22/23 0800 08/22/23 0830  BP: 138/85 (!) 146/82  (!) 138/98  Pulse: 61 (!) 56 81 68  Resp: 19 16 15 12   Temp:      TempSrc:      SpO2: 99% 100% 100% 99%  Weight:      Height:        Constitutional: NAD, calm, comfortable Vitals:   08/22/23 0630 08/22/23 0700 08/22/23 0800 08/22/23 0830  BP: 138/85 (!) 146/82  (!) 138/98  Pulse: 61 (!) 56 81 68  Resp: 19 16 15 12   Temp:      TempSrc:      SpO2: 99% 100% 100% 99%  Weight:      Height:       Eyes: PERRL, lids and conjunctivae normal ENMT: Mucous membranes are moist. Posterior pharynx clear of any exudate or lesions.Normal dentition.  Neck: normal, supple, no masses, no thyromegaly Respiratory: clear to auscultation bilaterally, no wheezing, no crackles. Normal respiratory effort. No accessory muscle use.  Cardiovascular: Regular rate and rhythm, no murmurs / rubs / gallops. No extremity edema. 2+ pedal pulses. No carotid bruits.  Abdomen: no tenderness, no masses palpated. No hepatosplenomegaly. Bowel sounds positive.  Musculoskeletal: no clubbing / cyanosis. No joint deformity upper and lower extremities. Good ROM, no contractures. Normal muscle tone.  Skin: no rashes, lesions, ulcers. No induration Neurologic: CN 2-12 grossly intact.  Decreased light touch sensation of left arm and hand and 5 fingers compared to the intact light touch sensation on right side, muscle strength left  arm and left shoulder 4/5 compared to 5/5 on right side Psychiatric: Normal judgment and insight. Alert and oriented x 3. Normal mood.     Labs on Admission: I have personally reviewed following labs and imaging studies  CBC: Recent Labs  Lab 08/21/23 1816  WBC 6.2  NEUTROABS 4.0  HGB 12.8  HCT 39.6  MCV 85.5  PLT 272   Basic Metabolic Panel: Recent Labs  Lab 08/21/23 1816  NA 137  K 3.3*  CL 103  CO2 25  GLUCOSE 100*  BUN 14  CREATININE 0.78  CALCIUM 8.9   GFR: Estimated Creatinine Clearance: 134.4 mL/min (by C-G formula based on SCr of 0.78 mg/dL). Liver Function Tests: Recent Labs  Lab 08/21/23 1816  AST 16  ALT 14  ALKPHOS 51  BILITOT <0.2  PROT 7.5  ALBUMIN 4.0   No results for input(s): "LIPASE", "AMYLASE" in  the last 168 hours. No results for input(s): "AMMONIA" in the last 168 hours. Coagulation Profile: Recent Labs  Lab 08/21/23 1816  INR 1.0   Cardiac Enzymes: No results for input(s): "CKTOTAL", "CKMB", "CKMBINDEX", "TROPONINI" in the last 168 hours. BNP (last 3 results) No results for input(s): "PROBNP" in the last 8760 hours. HbA1C: No results for input(s): "HGBA1C" in the last 72 hours. CBG: No results for input(s): "GLUCAP" in the last 168 hours. Lipid Profile: No results for input(s): "CHOL", "HDL", "LDLCALC", "TRIG", "CHOLHDL", "LDLDIRECT" in the last 72 hours. Thyroid Function Tests: No results for input(s): "TSH", "T4TOTAL", "FREET4", "T3FREE", "THYROIDAB" in the last 72 hours. Anemia Panel: No results for input(s): "VITAMINB12", "FOLATE", "FERRITIN", "TIBC", "IRON", "RETICCTPCT" in the last 72 hours. Urine analysis:    Component Value Date/Time   COLORURINE YELLOW 09/23/2016 1745   APPEARANCEUR HAZY (A) 09/23/2016 1745   LABSPEC 1.021 09/23/2016 1745   PHURINE 6.0 09/23/2016 1745   GLUCOSEU NEGATIVE 09/23/2016 1745   HGBUR NEGATIVE 09/23/2016 1745   BILIRUBINUR NEGATIVE 09/23/2016 1745   KETONESUR 5 (A) 09/23/2016 1745    PROTEINUR NEGATIVE 09/23/2016 1745   UROBILINOGEN 0.2 04/27/2016 0903   NITRITE NEGATIVE 09/23/2016 1745   LEUKOCYTESUR NEGATIVE 09/23/2016 1745    Radiological Exams on Admission: MR BRAIN W CONTRAST  Result Date: 08/22/2023 CLINICAL DATA:  Left-sided weakness and headache. EXAM: MRI HEAD WITH CONTRAST TECHNIQUE: Multiplanar, multiecho pulse sequences of the brain and surrounding structures were obtained with intravenous contrast. CONTRAST:  10mL GADAVIST GADOBUTROL 1 MMOL/ML IV SOLN COMPARISON:  CTA and brain MRI from 1 day prior FINDINGS: Brain: No abnormal enhancement. Vascular: Major vessels are enhancing and patent. Skull and upper cervical spine: Normal marrow signal. Sinuses/Orbits: Unremarkable IMPRESSION: No abnormal enhancement.  No explanation for symptoms. Electronically Signed   By: Tiburcio Pea M.D.   On: 08/22/2023 04:32   MR MRV HEAD W WO CONTRAST  Result Date: 08/22/2023 CLINICAL DATA:  Left-sided weakness with headache. EXAM: MR VENOGRAM HEAD WITHOUT AND WITH CONTRAST TECHNIQUE: Angiographic images of the intracranial venous structures were acquired using MRV technique without and with intravenous contrast. CONTRAST:  10mL GADAVIST GADOBUTROL 1 MMOL/ML IV SOLN COMPARISON:  Noncontrast brain MRI from earlier today. CTA of the head neck from earlier today. FINDINGS: No dural venous sinus thrombosis, stricture, or abnormal filling defect. No evidence of deep or cortical vein thrombosis either. IMPRESSION: Negative MRV. Electronically Signed   By: Tiburcio Pea M.D.   On: 08/22/2023 04:31   CT Angio Head Neck W WO CM  Result Date: 08/22/2023 CLINICAL DATA:  Initial evaluation for neuro deficit, stroke suspected. EXAM: CT ANGIOGRAPHY HEAD AND NECK WITH AND WITHOUT CONTRAST TECHNIQUE: Multidetector CT imaging of the head and neck was performed using the standard protocol during bolus administration of intravenous contrast. Multiplanar CT image reconstructions and MIPs were  obtained to evaluate the vascular anatomy. Carotid stenosis measurements (when applicable) are obtained utilizing NASCET criteria, using the distal internal carotid diameter as the denominator. RADIATION DOSE REDUCTION: This exam was performed according to the departmental dose-optimization program which includes automated exposure control, adjustment of the mA and/or kV according to patient size and/or use of iterative reconstruction technique. CONTRAST:  75mL OMNIPAQUE IOHEXOL 350 MG/ML SOLN COMPARISON:  Prior CT from earlier same day as well as previous CT and MRI from 05/27/2019. FINDINGS: CTA NECK FINDINGS Aortic arch: Examination degraded by motion artifact, somewhat limiting assessment. Visualized aortic arch within normal limits for caliber with standard 3 vessel morphology. No  stenosis or other abnormality about the origin of the great vessels. Right carotid system: No evidence of dissection, stenosis (50% or greater), or occlusion. Left carotid system: No evidence of dissection, stenosis (50% or greater), or occlusion. Vertebral arteries: Both vertebral arteries arise from the subclavian arteries. No proximal subclavian artery stenosis. Right vertebral artery slightly dominant. Vertebral arteries patent without stenosis or dissection. Skeleton: No discrete or worrisome osseous lesions. Other neck: No other acute finding. Upper chest: No other acute finding. Review of the MIP images confirms the above findings CTA HEAD FINDINGS Anterior circulation: Evaluation of the intracranial circulation somewhat limited by motion. Right ICA widely patent through the siphon without stenosis. Focal atheromatous plaque seen involving the paraclinoid left ICA with associated short-segment mild stenosis, new from prior (series 8, image 91). Both A1 segments patent bilaterally. Normal anterior communicating artery complex. Anterior cerebral arteries patent without stenosis. Left M1 segment widely patent. There is a focal  moderate to severe stenosis involving the distal right M1 segment (series 10, image 63). This is also seen on MIP reconstructions (series 7, image 20). Distal MCA branches well perfused and symmetric. Posterior circulation: Both V4 segments patent without stenosis. Both PICA patent. Basilar diminutive but patent to its distal aspect without stenosis. Superior cerebral arteries patent bilaterally. Predominant fetal type origin of the PCAs bilaterally. Both PCAs patent to their distal aspects without stenosis. Venous sinuses: Patent allowing for timing the contrast bolus. Anatomic variants: Dominant fetal type origin of the PCAs with overall diminutive vertebrobasilar system. No aneurysm. Review of the MIP images confirms the above findings IMPRESSION: 1. Negative CTA for large vessel occlusion or other emergent finding. No intracranial aneurysm. 2. 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 3. Focal atheromatous plaque involving the paraclinoid left ICA with associated short-segment mild stenosis, also new from prior. 4. Otherwise wide patency of the major arterial vasculature of the head and neck. Electronically Signed   By: Rise Mu M.D.   On: 08/22/2023 00:41   MR BRAIN WO CONTRAST  Result Date: 08/22/2023 CLINICAL DATA:  Initial evaluation for neuro deficit, stroke suspected. EXAM: MRI HEAD WITHOUT CONTRAST TECHNIQUE: Multiplanar, multiecho pulse sequences of the brain and surrounding structures were obtained without intravenous contrast. COMPARISON:  Prior CTs from earlier the same day. FINDINGS: Brain: Cerebral volume within normal limits. No significant cerebral white matter disease or other focal parenchymal signal abnormality. No evidence for acute or subacute ischemia. Gray-white matter differentiation maintained. No areas of chronic cortical infarction. No acute or chronic intracranial blood products. No mass  lesion, midline shift or mass effect. No hydrocephalus or extra-axial fluid collection. Partially empty sella noted.  Suprasellar region normal. Vascular: Major intracranial vascular flow voids are maintained. Skull and upper cervical spine: Craniocervical junction within normal limits. Bone marrow signal intensity mildly decreased on T1 weighted imaging, nonspecific, but most commonly related to anemia, smoking, or obesity. No scalp soft tissue abnormality. Sinuses/Orbits: Globes and orbital soft tissues within normal limits. Paranasal sinuses are clear. No mastoid effusion. Other: None. IMPRESSION: 1. No acute intracranial abnormality. 2. Partially empty sella. While often a normal variant, this can also be seen in the setting of idiopathic intracranial hypertension. 3. Otherwise unremarkable and normal brain MRI. Electronically Signed   By: Rise Mu M.D.   On: 08/22/2023 00:07   CT HEAD WO CONTRAST  Result Date: 08/21/2023 CLINICAL DATA:  Initial evaluation for neuro deficit, stroke suspected. Intermittent numbness for  1 month. EXAM: CT HEAD WITHOUT CONTRAST TECHNIQUE: Contiguous axial images were obtained from the base of the skull through the vertex without intravenous contrast. RADIATION DOSE REDUCTION: This exam was performed according to the departmental dose-optimization program which includes automated exposure control, adjustment of the mA and/or kV according to patient size and/or use of iterative reconstruction technique. COMPARISON:  Prior study from 05/26/2019. FINDINGS: Brain: Cerebral volume within normal limits for patient age. No evidence for acute intracranial hemorrhage. No findings to suggest acute large vessel territory infarct. No mass lesion, midline shift, or mass effect. Ventricles are normal in size without evidence for hydrocephalus. No extra-axial fluid collection identified. Vascular: No hyperdense vessel identified. Skull: Scalp soft tissues demonstrate no acute  abnormality. Calvarium intact. Sinuses/Orbits: Globes and orbital soft tissues within normal limits. Visualized paranasal sinuses are clear. No mastoid effusion. IMPRESSION: Normal head CT. No acute intracranial abnormality identified. Electronically Signed   By: Rise Mu M.D.   On: 08/21/2023 19:03    EKG: Independently reviewed.  Sinus rhythm, first-degree AV block  Assessment/Plan Principal Problem:   TIA (transient ischemic attack)  (please populate well all problems here in Problem List. (For example, if patient is on BP meds at home and you resume or decide to hold them, it is a problem that needs to be her. Same for CAD, COPD, HLD and so on)  TIA -Left-sided paresis and left-sided vision changes -Normal brain MRI with and without contrast, normal MRV -Neurology consultation appreciated, neurology recommended further lumbar puncture to rule out idiopathic intracranial hypertension.  Discussed with radiology, ordered opening pressure, CSF protein, glucose  HTN emergency -Resume home BP meds see including HCTZ and lisinopril -Add amlodipine  Morbid obesity -BMI= 38, recommend calorie control -Outpatient sleep study to rule out OSA  DVT prophylaxis: Lovenox Code Status: Full code Family Communication: None at bedside Disposition Plan: Expect less than 2 midnight hospital stay Consults called: Neurology Admission status: Tele obs   Emeline General MD [facility] Pager [phone]  08/22/2023, 9:47 AM

## 2023-08-22 NOTE — Progress Notes (Signed)
SLP Cancellation Note  Patient Details Name: Kylie Huerta MRN: [id number] DOB: 1991/02/14   Cancelled treatment:       Reason Eval/Treat Not Completed: SLP screened, no needs identified, will sign off (chart reviewed; consulted NSG and met w/ pt in room.)  Pt denied any difficulty swallowing and has been drinking gingerale w/out difficulty. She is currently on a regular diet which arrived during session; tolerates swallowing pills w/ water per NSG. Discussed general aspiration precautions and helped pt to position the bed upright for the meal.  Pt conversed in full conversation w/ NSG and this therapist w/out overt expressive/receptive deficits noted; pt denied any speech-language deficits. Speech clear, intelligible. No difficulty texting on phone reported. No further skilled ST services indicated as pt appears at her communication baseline. Pt agreed. NSG to reconsult if any change in status while admitted.      Jerilynn Som, MS, CCC-SLP Speech Language Pathologist Rehab Services; [facility] [phone] (ascom) Micky Overturf 08/22/2023, 2:53 PM

## 2023-08-22 NOTE — Progress Notes (Addendum)
PHARMACIST - PHYSICIAN COMMUNICATION  CONCERNING:  Enoxaparin (Lovenox) for DVT Prophylaxis    RECOMMENDATION: Patient was prescribed enoxaprin 40mg  q24 hours for VTE prophylaxis.   Filed Weights   08/21/23 1814  Weight: 114.8 kg (253 lb)    Body mass index is 38.47 kg/m.  Estimated Creatinine Clearance: 134.4 mL/min (by C-G formula based on SCr of 0.78 mg/dL).   Based on [facility] policy patient is candidate for enoxaparin 0.5mg /kg TBW SQ every 24 hours based on BMI being >30.   DESCRIPTION: Pharmacy has adjusted enoxaparin dose per [facility] policy.  Patient is now receiving enoxaparin 55 mg every 24 hours    Barrie Folk, PharmD Clinical Pharmacist  08/22/2023 8:43 AM

## 2023-08-22 NOTE — Procedures (Signed)
PROCEDURE SUMMARY:  Successful fluoroscopic guided LP. Opening pressure 22 cm of H20. Closing pressure of 15 cm of H20. Yielded 12 mL of colorless CSF fluid.  No immediate complications.  Pt tolerated well.   Specimen was sent for labs.  EBL < 5mL  Berneta Levins PA-C 08/22/2023 10:22 AM

## 2023-08-22 NOTE — ED Notes (Signed)
called to telespecialist per MD Ward/stat consult/rep skylar @125a 

## 2023-08-22 NOTE — ED Notes (Signed)
Pt alert, NAD, calm, interactive. Texting on phone. Taken to 105.

## 2023-08-22 NOTE — ED Notes (Signed)
Tele neuro cart is in Rm at this time. Dr. Stephannie Peters present on tele monitor speaking with pt.

## 2023-08-22 NOTE — Progress Notes (Incomplete)
OT Cancellation Note  Patient Details Name: CASARAH GARRICK MRN: [id number] DOB: 21-Jun-1991   Cancelled Treatment:    Reason Eval/Treat Not Completed: OT screened, no needs identified, will sign off. Order received, chart reviewed. On arrival pt endorses intermittent numbness/weakness in non-dominant LUE intermittently present but not impacting independence with ADLs over the past month. Educated on OT role in acute and post-acute setting, pt politely declines assessment at this time. Educated on   Pt back to baseline functional independence. No skilled OT needs identified. Will sign off. Please re-consult if additional needs arise.   Kathie Dike, M.S. OTR/L  08/22/23, 2:25 PM  ascom [phone]

## 2023-08-22 NOTE — Consult Note (Signed)
[facility] Tele[facility]urology Consult Services  Stat Consult  Patient Name:   Kylie Huerta, Kylie Huerta Date of Birth:   14-Aug-1991 Identification Number:   MRN - [id number] Date of Service:   08/22/2023 01:27:58  Diagnosis:       R20.2 - Paresthesia of skin  Impression This is a 32 year old woman with history of hypertension and obesity here with headache, visual changes, and numbness, neurology consulting. Given that her numbness affects her tongue bilaterally, and, her left cheek more than the remainder of the left side of her face- I would like to get an MRI brain with contrast- meningeal inflammation can cause multiple cranial neuropathies. I would like to have attention to optic nerves- given her reports of intermittent visual loss affecting her left eye. I would also like to get an MRV- venous sinus thrombosis can cause increased intracranial pressure. If imaging is unremarkable and no cause for her symptoms is uncovered- lumbar puncture to assess for increased ICP should be considered- given pulsatile tinnitus and headache that wakes her from sleep along with partially empty sella- increased ICP is certainly a possibility here   Recommendations: Our recommendations are outlined below.  Miscellaneous : MRI brain with contrast/ attention to optic nerves MRV If imaging is unremarkable/no cause for symptoms is found- LP with OPENING PRESSURE, cells protein glucose save extra fluid    ----------------------------------------------------------------------------------------------------    Metrics: Dispatch Time: 08/22/2023 01:24:07 Callback Response Time: 08/22/2023 01:28:47  Primary Provider Notified of Diagnostic Impression and Management Plan on: 08/22/2023 02:45:25     ----------------------------------------------------------------------------------------------------  Chief Complaint: headache numbness  History of Present Illness: Patient is a 32 year old  Female. This is a 32 year old woman with history of hypertension and obesity here with headache, visual changes, and numbness, neurology consulting. She has had numbness for the past month; initially this affected the fingertips on her left hand. This has spread to include her entire arm, and, to her face. On the day of presentation she was having difficulty typing at work- due to left upper extremity weakness. The patient stated that she also has bilateral tongue numbness. She noted that her left cheek is more numb than the remainder of her face. She has had pulsatile "wooshing" noises in her right ear for the past month- worse lying down. She has also had headache, worse on the left, over the back of her head. This wakes her from sleep sometimes and- the back of her head is sensitive to touch. She stated that sometimes when she remains upright for too long she needs to lie down. Headache is worse with cough or "bearing down." She has had intermittent visual changes affecting her left eye- she occasionally sees "blobs" in her left eye that block part of her vision   Past Medical History:      Hypertension Other PMH:  hypertension obesity  Medications:  No Anticoagulant use  No Antiplatelet use Reviewed EMR for current medications  Allergies:  Reviewed  Social History: Drug Use: No  Family History:  There is no family history of premature cerebrovascular disease pertinent to this consultation  ROS : 14 Points Review of Systems was performed and was negative except mentioned in HPI.  Past Surgical History: There Is No Surgical History Contributory To Today's Visit   Examination: BP(159/107), Pulse(77), 1A: Level of Consciousness - Alert; keenly responsive + 0 1B: Ask Month and Age - Both Questions Right + 0 1C: Blink Eyes & Squeeze Hands - Performs Both Tasks + 0  2: Test Horizontal Extraocular Movements - Normal + 0 3: Test Visual Fields - No Visual Loss + 0 4: Test Facial Palsy  (Use Grimace if Obtunded) - Normal symmetry + 0 5A: Test Left Arm Motor Drift - No Drift for 10 Seconds + 0 5B: Test Right Arm Motor Drift - No Drift for 10 Seconds + 0 6A: Test Left Leg Motor Drift - No Drift for 5 Seconds + 0 6B: Test Right Leg Motor Drift - No Drift for 5 Seconds + 0 7: Test Limb Ataxia (FNF/Heel-Shin) - No Ataxia + 0 8: Test Sensation - Mild-Moderate Loss: Less Sharp/More Dull + 1 9: Test Language/Aphasia - Normal; No aphasia + 0 10: Test Dysarthria - Normal + 0 11: Test Extinction/Inattention - No abnormality + 0  NIHSS Score: 1  Spoke with : Dr Elesa Massed    This consult was conducted in real time using interactive audio and Immunologist. Patient was informed of the technology being used for this visit and agreed to proceed. Patient located in hospital and provider located at home/office setting.  Patient is being evaluated for possible acute neurologic impairment and high probability of imminent or life - threatening deterioration.I spent total of 35 minutes providing care to this patient, including time for face to face visit via telemedicine, review of medical records, imaging studies and discussion of findings with providers, the patient and / or family.   Dr Kylie Huerta   [facility] For Inpatient follow-up with [facility] physician please call [facility] at [phone]. As we are not an outpatient service for any post hospital discharge needs please contact the hospital for assistance.  If you have any questions for the [facility] physicians or need to reconsult for clinical or diagnostic changes please contact us via [facility] at [phone].

## 2023-08-22 NOTE — ED Notes (Signed)
Pending LP, radiology notified ED of pending/ imminent call

## 2023-08-22 NOTE — Progress Notes (Signed)
MRI MRV and LP results discussed with with on-call neurology Dr. Iver Nestle, who plans to see the patient tomorrow for further recommendations.  LP showed normal opening pressure and cell count and differentiation indicate no active infection.

## 2023-08-22 NOTE — ED Notes (Signed)
Pt not in room, pt in radiology for LP.

## 2023-08-22 NOTE — ED Notes (Signed)
Remains in radiology for LP, not in room.

## 2023-08-22 NOTE — Plan of Care (Signed)

## 2023-08-22 NOTE — ED Notes (Signed)
CCMD contacted re: pending imminent transfer of pt to 105. Report called. Ready for pt. NS and transport notified.

## 2023-08-23 ENCOUNTER — Telehealth: Payer: Self-pay | Admitting: Emergency Medicine

## 2023-08-23 ENCOUNTER — Observation Stay
Admit: 2023-08-23 | Discharge: 2023-08-23 | Disposition: A | Discharge status: Home / Self Care | Payer: Managed Care, Other (non HMO) | Attending: Internal Medicine | Admitting: Internal Medicine

## 2023-08-23 DIAGNOSIS — G459 Transient cerebral ischemic attack, unspecified: Secondary | ICD-10-CM | POA: Diagnosis not present

## 2023-08-23 DIAGNOSIS — R531 Weakness: Secondary | ICD-10-CM | POA: Diagnosis not present

## 2023-08-23 LAB — ECHOCARDIOGRAM COMPLETE
AR max vel: 1.76 cm2
AV Area VTI: 2.16 cm2
AV Area mean vel: 1.75 cm2
AV Mean grad: 3 mm[Hg]
AV Peak grad: 4.6 mm[Hg]
Ao pk vel: 1.07 m/s
Area-P 1/2: 4.06 cm2
Height: 68 in
S' Lateral: 2.9 cm
Weight: 4048 [oz_av]

## 2023-08-23 LAB — CSF CELL COUNT WITH DIFFERENTIAL
Eosinophils, CSF: 0 %
Lymphs, CSF: 90 %
Monocyte-Macrophage-Spinal Fluid: 10 %
RBC Count, CSF: 52 /mm3 — ABNORMAL HIGH (ref 0–3)
Segmented Neutrophils-CSF: 0 %
Tube #: 3
WBC, CSF: 2 /mm3 (ref 0–5)

## 2023-08-23 LAB — LIPID PANEL
Cholesterol: 226 mg/dL — ABNORMAL HIGH (ref 0–200)
HDL: 52 mg/dL (ref >40)
LDL Cholesterol: 152 mg/dL — ABNORMAL HIGH (ref 0–99)
Total CHOL/HDL Ratio: 4.3 {ratio}
Triglycerides: 109 mg/dL (ref <150)
VLDL: 22 mg/dL (ref 0–40)

## 2023-08-23 LAB — VITAMIN B12: Vitamin B-12: 233 pg/mL (ref 180–914)

## 2023-08-23 LAB — SEDIMENTATION RATE: Sed Rate: 12 mm/h (ref 0–20)

## 2023-08-23 LAB — C-REACTIVE PROTEIN: CRP: 0.7 mg/dL (ref <1.0)

## 2023-08-23 MED ORDER — ASPIRIN 81 MG PO CHEW
81.0000 mg | CHEWABLE_TABLET | Freq: Every day | ORAL | Status: DC
Start: 2023-08-23 — End: 2023-08-23
  Administered 2023-08-23: 81 mg via ORAL
  Filled 2023-08-23: qty 1

## 2023-08-23 MED ORDER — ASPIRIN 81 MG PO CHEW: 81.0000 mg | CHEWABLE_TABLET | Freq: Every day | ORAL | 0 refills | Status: AC

## 2023-08-23 MED ORDER — HYDROCHLOROTHIAZIDE 12.5 MG PO TABS: 12.5000 mg | ORAL_TABLET | Freq: Every day | ORAL | 2 refills | Status: DC

## 2023-08-23 MED ORDER — IBUPROFEN 400 MG PO TABS
400.0000 mg | ORAL_TABLET | Freq: Once | ORAL | Status: AC
  Administered 2023-08-23: 400 mg via ORAL
  Filled 2023-08-23: qty 1

## 2023-08-23 MED ORDER — ATORVASTATIN CALCIUM 20 MG PO TABS
20.0000 mg | ORAL_TABLET | Freq: Every day | ORAL | Status: DC
  Administered 2023-08-23: 20 mg via ORAL
  Filled 2023-08-23: qty 1

## 2023-08-23 MED ORDER — ATORVASTATIN CALCIUM 20 MG PO TABS: 20.0000 mg | ORAL_TABLET | Freq: Every day | ORAL | 1 refills | Status: DC

## 2023-08-23 NOTE — Telephone Encounter (Signed)
Lab called and reported that pts cell count on CSF was entered incorrectly yesterday. Cell count was 10% monocytes. EDP Siadecki informed. No further action needed.

## 2023-08-23 NOTE — Discharge Summary (Signed)
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 female with medical history significant of HTN, anxiety/depression, presented with persistent left-sided weakness, vision changes.  Symptoms started 3-4 weeks ago, patient started having episode of numbness initially left side face and then extended to left shoulder and left arm, no numbness weakness of lower extremities.  She has HTN and checks her BP in the evening and reported that her BP has been poorly controlled with SBP more than 200 in the mornings, she takes HCTZ/lisinopril only.  CTA negative for large vessel occlusion Brain MRI showed no acute intracranial abnormalities but partially empty sella implying for idiopathic intracranial hypertension.  MRV and MRI with contrast both negative.  Echo prelim--overall WNL LP opening pressure normal  TIA vs Complex HA related symptoms -Left-sided paresis and left-sided vision changes -Normal brain MRI with and without contrast, normal MRV -Neurology consultation appreciated, neurology recommended further lumbar puncture to rule out idiopathic intracranial hypertension.   --LP with normal opening pressure --pt does not have motor deficit.  --has some HA--will give Ibuprofen. Recommended pt get referral to see HA specialist in the are. She voiced understanding -- ASA every day --Above was d/w Neurology Dr Iver Nestle. Pt will d/c home  HTN Malignant -Resume home BP meds see including HCTZ and lisinopril --bp stable  HL --will start po statins    Morbid obesity --BMI= 38, recommend calorie control --Outpatient sleep study to rule out OSA --pt in the process of getting Bariatric surgery evaluation       Consultants: Neurology Procedures performed: LP  Disposition: Home Diet recommendation:  Discharge Diet Orders (From admission, onward)     Start     Ordered   08/23/23 0000  Diet - low sodium heart healthy        08/23/23 0958            DISCHARGE MEDICATION: Allergies as of 08/23/2023       Reactions   Almond Oil Shortness Of Breath, Swelling   Tree Extract Anaphylaxis, Swelling   Kiwi Extract Hives   Pollen Extract         Medication List     TAKE these medications    albuterol 108 (90 Base) MCG/ACT inhaler Commonly known as: VENTOLIN HFA Inhale 2 puffs into the lungs every 6 (six) hours as needed for wheezing or shortness of breath.   aspirin 81 MG chewable tablet Chew 1 tablet (81 mg total) by mouth daily.   atorvastatin 20 MG tablet Commonly known as: LIPITOR Take 1 tablet (20 mg total) by mouth daily.   cetirizine 10 MG tablet Commonly known as: ZYRTEC Take 10 mg by mouth daily.   EPINEPHrine 0.3 mg/0.3 mL Soaj injection Commonly known as: EPI-PEN Inject into the muscle.   hydrochlorothiazide 12.5 MG tablet Commonly known as: HYDRODIURIL Take 1 tablet (12.5 mg total) by mouth daily.   lisinopril 10 MG tablet Commonly known as: ZESTRIL Take 1 tablet (10 mg total) by mouth daily.        Follow-up Information  [facility]. Schedule an appointment as soon as possible for a visit in 1 week(s).   Specialty: Family Medicine Contact information: [address] [place] [address] [phone]                Discharge Exam: Ceasar Mons Weights   08/21/23 1814  Weight: 114.8 kg   A and Ox3 Resp CTA CVS s1s2 normal no murmur Neurology: no focal weakness  Condition at discharge: good  The results of significant  diagnostics from this hospitalization (including imaging, microbiology, ancillary and laboratory) are listed below for reference.   Imaging Studies: DG FL GUIDED LUMBAR PUNCTURE  Result Date: 08/22/2023 INDICATION: Provided history: Increased intracranial pressure. Additional history: headaches, vision changes, numbness, concern for elevated intracranial pressure, request received for diagnostic and therapeutic lumbar puncture. EXAM: DIAGNOSTIC AND THERAPEUTIC LUMBAR PUNCTURE UNDER FLUOROSCOPIC GUIDANCE COMPARISON:  MRI brain and MRV head 08/22/2023. Brain MRI 08/21/2023. FLUOROSCOPY TIME:  Radiation Exposure Index (if provided by the fluoroscopic device): 24 seconds, 8.4 mGy COMPLICATIONS: None. PROCEDURE: Pattricia Boss, PA-C obtained informed consent from the patient prior to the procedure. This process included a discussion of procedure risks including but not limited to bleeding, infection, paresthesias, nerve injury, CSF leak requiring additional procedures, post-procedure requirement to lay flat for several hours, headache, allergic reaction, pain. The patient was positioned prone on the fluoroscopy table. An appropriate skin entry site was determined under fluoroscopy and marked. The operator donned sterile gloves and a mask. The lower back was prepped and draped in the usual sterile fashion. Local anesthesia was provided with 1% lidocaine. Under intermittent fluoroscopy, lumbar puncture was performed at the L5-S1 level using a 20 gauge spinal needle with return of clear/colorless CSF and an opening pressure of 22 cm water. 12 mL of CSF were collected for laboratory studies. There was a closing pressure of 15 cm water. The inner stylet was replaced within the needle and the needle was removed in its entirety. A dressing was applied at the skin entry site. The patient tolerated the procedure well and no immediate post-procedure complication was apparent. The procedure was performed by Pattricia Boss,  PA-C, supervised by Dr. Jackey Loge. IMPRESSION: 1. Technically successful fluoroscopically-guided L5-S1 lumbar puncture. 2. Opening pressure: 22 cm water. 3. 12 mL of CSF collected and sent for laboratory studies. 4. Closing pressure: 15 cm water. 5. No immediate post-procedure complication. Electronically Signed   By: Jackey Loge D.O.   On: 08/22/2023 13:15   MR BRAIN W CONTRAST  Result Date: 08/22/2023 CLINICAL DATA:  Left-sided weakness and headache. EXAM: MRI HEAD WITH CONTRAST TECHNIQUE: Multiplanar, multiecho pulse sequences of the brain and surrounding structures were obtained with intravenous contrast. CONTRAST:  10mL GADAVIST GADOBUTROL 1 MMOL/ML IV SOLN COMPARISON:  CTA and brain MRI from 1 day prior FINDINGS: Brain: No abnormal enhancement. Vascular: Major vessels are enhancing and patent. Skull and upper cervical spine: Normal marrow signal. Sinuses/Orbits: Unremarkable IMPRESSION: No abnormal enhancement.  No explanation for symptoms. Electronically Signed   By: Tiburcio Pea M.D.   On: 08/22/2023 04:32   MR MRV HEAD W WO CONTRAST  Result Date: 08/22/2023 CLINICAL DATA:  Left-sided weakness with headache. EXAM: MR VENOGRAM HEAD WITHOUT AND WITH CONTRAST TECHNIQUE: Angiographic images of the intracranial venous structures were acquired using MRV technique without and with intravenous contrast. CONTRAST:  10mL GADAVIST GADOBUTROL 1 MMOL/ML IV SOLN COMPARISON:  Noncontrast brain MRI from earlier today. CTA of the head neck from earlier today. FINDINGS:  No dural venous sinus thrombosis, stricture, or abnormal filling defect. No evidence of deep or cortical vein thrombosis either. IMPRESSION: Negative MRV. Electronically Signed   By: Tiburcio Pea M.D.   On: 08/22/2023 04:31   CT Angio Head Neck W WO CM  Result Date: 08/22/2023 CLINICAL DATA:  Initial evaluation for neuro deficit, stroke suspected. EXAM: CT ANGIOGRAPHY HEAD AND NECK WITH AND WITHOUT CONTRAST TECHNIQUE: Multidetector CT  imaging of the head and neck was performed using the standard protocol during bolus administration of intravenous contrast. Multiplanar CT image reconstructions and MIPs were obtained to evaluate the vascular anatomy. Carotid stenosis measurements (when applicable) are obtained utilizing NASCET criteria, using the distal internal carotid diameter as the denominator. RADIATION DOSE REDUCTION: This exam was performed according to the departmental dose-optimization program which includes automated exposure control, adjustment of the mA and/or kV according to patient size and/or use of iterative reconstruction technique. CONTRAST:  75mL OMNIPAQUE IOHEXOL 350 MG/ML SOLN COMPARISON:  Prior CT from earlier same day as well as previous CT and MRI from 05/27/2019. FINDINGS: CTA NECK FINDINGS Aortic arch: Examination degraded by motion artifact, somewhat limiting assessment. Visualized aortic arch within normal limits for caliber with standard 3 vessel morphology. No stenosis or other abnormality about the origin of the great vessels. Right carotid system: No evidence of dissection, stenosis (50% or greater), or occlusion. Left carotid system: No evidence of dissection, stenosis (50% or greater), or occlusion. Vertebral arteries: Both vertebral arteries arise from the subclavian arteries. No proximal subclavian artery stenosis. Right vertebral artery slightly dominant. Vertebral arteries patent without stenosis or dissection. Skeleton: No discrete or worrisome osseous lesions. Other neck: No other acute finding. Upper chest: No other acute finding. Review of the MIP images confirms the above findings CTA HEAD FINDINGS Anterior circulation: Evaluation of the intracranial circulation somewhat limited by motion. Right ICA widely patent through the siphon without stenosis. Focal atheromatous plaque seen involving the paraclinoid left ICA with associated short-segment mild stenosis, new from prior (series 8, image 91). Both A1  segments patent bilaterally. Normal anterior communicating artery complex. Anterior cerebral arteries patent without stenosis. Left M1 segment widely patent. There is a focal moderate to severe stenosis involving the distal right M1 segment (series 10, image 63). This is also seen on MIP reconstructions (series 7, image 20). Distal MCA branches well perfused and symmetric. Posterior circulation: Both V4 segments patent without stenosis. Both PICA patent. Basilar diminutive but patent to its distal aspect without stenosis. Superior cerebral arteries patent bilaterally. Predominant fetal type origin of the PCAs bilaterally. Both PCAs patent to their distal aspects without stenosis. Venous sinuses: Patent allowing for timing the contrast bolus. Anatomic variants: Dominant fetal type origin of the PCAs with overall diminutive vertebrobasilar system. No aneurysm. Review of the MIP images confirms the above findings IMPRESSION: 1. Negative CTA for large vessel occlusion or other emergent finding. No intracranial aneurysm. 2. 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 3. Focal atheromatous plaque involving the paraclinoid left ICA with associated short-segment mild stenosis, also new from prior. 4. Otherwise wide patency of the major arterial vasculature of the head and neck. Electronically Signed   By: Rise Mu M.D.   On: 08/22/2023 00:41   MR BRAIN WO CONTRAST  Result Date: 08/22/2023 CLINICAL DATA:  Initial evaluation for neuro deficit, stroke suspected. EXAM: MRI HEAD WITHOUT CONTRAST TECHNIQUE: Multiplanar, multiecho pulse sequences of the brain and surrounding structures were obtained  without intravenous contrast. COMPARISON:  Prior CTs from earlier the same day. FINDINGS: Brain: Cerebral volume within normal limits. No significant cerebral white matter disease or other focal parenchymal signal abnormality. No evidence for  acute or subacute ischemia. Gray-white matter differentiation maintained. No areas of chronic cortical infarction. No acute or chronic intracranial blood products. No mass lesion, midline shift or mass effect. No hydrocephalus or extra-axial fluid collection. Partially empty sella noted.  Suprasellar region normal. Vascular: Major intracranial vascular flow voids are maintained. Skull and upper cervical spine: Craniocervical junction within normal limits. Bone marrow signal intensity mildly decreased on T1 weighted imaging, nonspecific, but most commonly related to anemia, smoking, or obesity. No scalp soft tissue abnormality. Sinuses/Orbits: Globes and orbital soft tissues within normal limits. Paranasal sinuses are clear. No mastoid effusion. Other: None. IMPRESSION: 1. No acute intracranial abnormality. 2. Partially empty sella. While often a normal variant, this can also be seen in the setting of idiopathic intracranial hypertension. 3. Otherwise unremarkable and normal brain MRI. Electronically Signed   By: Rise Mu M.D.   On: 08/22/2023 00:07   CT HEAD WO CONTRAST  Result Date: 08/21/2023 CLINICAL DATA:  Initial evaluation for neuro deficit, stroke suspected. Intermittent numbness for 1 month. EXAM: CT HEAD WITHOUT CONTRAST TECHNIQUE: Contiguous axial images were obtained from the base of the skull through the vertex without intravenous contrast. RADIATION DOSE REDUCTION: This exam was performed according to the departmental dose-optimization program which includes automated exposure control, adjustment of the mA and/or kV according to patient size and/or use of iterative reconstruction technique. COMPARISON:  Prior study from 05/26/2019. FINDINGS: Brain: Cerebral volume within normal limits for patient age. No evidence for acute intracranial hemorrhage. No findings to suggest acute large vessel territory infarct. No mass lesion, midline shift, or mass effect. Ventricles are normal in size  without evidence for hydrocephalus. No extra-axial fluid collection identified. Vascular: No hyperdense vessel identified. Skull: Scalp soft tissues demonstrate no acute abnormality. Calvarium intact. Sinuses/Orbits: Globes and orbital soft tissues within normal limits. Visualized paranasal sinuses are clear. No mastoid effusion. IMPRESSION: Normal head CT. No acute intracranial abnormality identified. Electronically Signed   By: Rise Mu M.D.   On: 08/21/2023 19:03    Microbiology: Results for orders placed or performed during the hospital encounter of 03/26/23  SARS Coronavirus 2 by RT PCR (hospital order, performed in [facility] hospital lab) *cepheid single result test* Anterior Nasal Swab     Status: None   Collection Time: 03/26/23  4:08 AM   Specimen: Anterior Nasal Swab  Result Value Ref Range Status   SARS Coronavirus 2 by RT PCR NEGATIVE NEGATIVE Final    Comment: (NOTE) SARS-CoV-2 target nucleic acids are NOT DETECTED.  The SARS-CoV-2 RNA is generally detectable in upper and lower respiratory specimens during the acute phase of infection. The lowest concentration of SARS-CoV-2 viral copies this assay can detect is 250 copies / mL. A negative result does not preclude SARS-CoV-2 infection and should not be used as the sole basis for treatment or other patient management decisions.  A negative result may occur with improper specimen collection / handling, submission of specimen other than nasopharyngeal swab, presence of viral mutation(s) within the areas targeted by this assay, and inadequate number of viral copies (<250 copies / mL). A negative result must be combined with clinical observations, patient history, and epidemiological information.  Fact Sheet for Patients:   RoadLapTop.co.za  Fact Sheet for Healthcare Providers: http://kim-miller.com/  This test is not yet approved  or  cleared by the [place]  and has been authorized for detection and/or diagnosis of SARS-CoV-2 by FDA under an Emergency Use Authorization (EUA).  This EUA will remain in effect (meaning this test can be used) for the duration of the COVID-19 declaration under Section 564(b)(1) of the Act, 21 U.S.C. section 360bbb-3(b)(1), unless the authorization is terminated or revoked sooner.  Performed at [facility], [address] [place], [place] [address]     Labs: CBC: Recent Labs  Lab 08/21/23 1816  WBC 6.2  NEUTROABS 4.0  HGB 12.8  HCT 39.6  MCV 85.5  PLT 272   Basic Metabolic Panel: Recent Labs  Lab 08/21/23 1816  NA 137  K 3.3*  CL 103  CO2 25  GLUCOSE 100*  BUN 14  CREATININE 0.78  CALCIUM 8.9   Liver Function Tests: Recent Labs  Lab 08/21/23 1816  AST 16  ALT 14  ALKPHOS 51  BILITOT <0.2  PROT 7.5  ALBUMIN 4.0    Discharge time spent: greater than 30 minutes.  Signed: Enedina Finner, MD [facility] 08/23/2023

## 2023-08-23 NOTE — Discharge Instructions (Signed)
Pt advised to get referral to see Headache specialist in the area

## 2023-08-23 NOTE — Plan of Care (Signed)

## 2023-08-23 NOTE — TOC Transition Note (Signed)
Transition of Care [facility]) - CM/SW Discharge Note   Patient Details  Name: Kylie Huerta MRN: [id number] Date of Birth: [birth year]  [facility] CM/SW Contact:  Allena Katz, LCSW Phone Number: 08/23/2023, 11:02 AM   Clinical Narrative:   Pt has orders to discharge home. No TOC needs. Pt requesting voucher for home transport. Voucher provided. Rider waiver signed and attached to patients chart.    Final next level of care: Home/Self Care Barriers to Discharge: Barriers Resolved   Patient Goals and CMS Choice      Discharge Placement                  Patient to be transferred to facility by: [facility]      Discharge Plan and Services Additional resources added to the After Visit Summary for                                       Social Determinants of Health (SDOH) Interventions SDOH Screenings   Food Insecurity: No Food Insecurity (08/22/2023)  Housing: Low Risk  (08/22/2023)  Transportation Needs: No Transportation Needs (08/22/2023)  Utilities: Not At Risk (08/22/2023)  Depression (PHQ2-9): High Risk (08/24/2022)  Financial Resource Strain: Low Risk  (03/29/2023)   Received from [facility]  Physical Activity: Unknown (03/29/2023)   Received from [facility], [facility]  Social Connections: Moderately Integrated (03/29/2023)   Received from [facility], [facility]  Stress: No Stress Concern Present (03/29/2023)   Received from [facility], [facility]  Tobacco Use: Low Risk  (08/22/2023)     Readmission Risk Interventions     No data to display

## 2023-08-23 NOTE — Progress Notes (Signed)
*  PRELIMINARY RESULTS* Echocardiogram 2D Echocardiogram has been performed.  Cristela Blue 08/23/2023, 10:06 AM

## 2023-08-24 ENCOUNTER — Other Ambulatory Visit: Payer: Self-pay

## 2023-08-24 DIAGNOSIS — I10 Essential (primary) hypertension: Secondary | ICD-10-CM | POA: Diagnosis not present

## 2023-08-24 DIAGNOSIS — G971 Other reaction to spinal and lumbar puncture: Secondary | ICD-10-CM | POA: Diagnosis not present

## 2023-08-24 DIAGNOSIS — R519 Headache, unspecified: Secondary | ICD-10-CM | POA: Diagnosis present

## 2023-08-24 DIAGNOSIS — R7309 Other abnormal glucose: Secondary | ICD-10-CM | POA: Diagnosis not present

## 2023-08-24 LAB — CBC
HCT: 41.5 % (ref 36.0–46.0)
Hemoglobin: 13.5 g/dL (ref 12.0–15.0)
MCH: 27.2 pg (ref 26.0–34.0)
MCHC: 32.5 g/dL (ref 30.0–36.0)
MCV: 83.7 fL (ref 80.0–100.0)
Platelets: 295 10*3/uL (ref 150–400)
RBC: 4.96 MIL/uL (ref 3.87–5.11)
RDW: 13.1 % (ref 11.5–15.5)
WBC: 6.1 10*3/uL (ref 4.0–10.5)
nRBC: 0 % (ref 0.0–0.2)

## 2023-08-24 LAB — CBG MONITORING, ED: Glucose-Capillary: 105 mg/dL — ABNORMAL HIGH (ref 70–99)

## 2023-08-24 LAB — BASIC METABOLIC PANEL
Anion gap: 7 (ref 5–15)
BUN: 11 mg/dL (ref 6–20)
CO2: 25 mmol/L (ref 22–32)
Calcium: 9.1 mg/dL (ref 8.9–10.3)
Chloride: 105 mmol/L (ref 98–111)
Creatinine, Ser: 0.97 mg/dL (ref 0.44–1.00)
GFR, Estimated: >60 mL/min (ref >60)
Glucose, Bld: 102 mg/dL — ABNORMAL HIGH (ref 70–99)
Potassium: 3.7 mmol/L (ref 3.5–5.1)
Sodium: 137 mmol/L (ref 135–145)

## 2023-08-24 LAB — ANA W/REFLEX IF POSITIVE: Anti Nuclear Antibody (ANA): NEGATIVE

## 2023-08-24 NOTE — ED Triage Notes (Addendum)
Pt had spinal tap on Friday- c/o headache since procedure and back pain that got worse last night. Pt is AOX4, appears uncomfortable. Pt reports intermittent poor vision in right eye and intermittent facial numbness on left side of face today 8 hours ago. Pt reports dizziness. Smile is symmetric, PERRLA noted, sensation equal in upper and lower extremities, Left arm weak from previous hospitalization, lower extremities equal in strength.

## 2023-08-25 ENCOUNTER — Emergency Department
Admission: EM | Admit: 2023-08-25 | Discharge: 2023-08-25 | Disposition: A | Discharge status: Home / Self Care | Payer: Managed Care, Other (non HMO) | Attending: Emergency Medicine | Admitting: Emergency Medicine

## 2023-08-25 DIAGNOSIS — G971 Other reaction to spinal and lumbar puncture: Secondary | ICD-10-CM | POA: Diagnosis not present

## 2023-08-25 DIAGNOSIS — R51 Headache with orthostatic component, not elsewhere classified: Secondary | ICD-10-CM

## 2023-08-25 LAB — URINALYSIS, ROUTINE W REFLEX MICROSCOPIC
Bilirubin Urine: NEGATIVE
Glucose, UA: NEGATIVE mg/dL
Hgb urine dipstick: NEGATIVE
Ketones, ur: NEGATIVE mg/dL
Leukocytes,Ua: NEGATIVE
Nitrite: NEGATIVE
Protein, ur: NEGATIVE mg/dL
Specific Gravity, Urine: 1.024 (ref 1.005–1.030)
pH: 5 (ref 5.0–8.0)

## 2023-08-25 LAB — TROPONIN I (HIGH SENSITIVITY): Troponin I (High Sensitivity): 3 ng/L (ref <18)

## 2023-08-25 LAB — VITAMIN B6: Vitamin B6: 9 ug/L (ref 3.4–65.2)

## 2023-08-25 LAB — POC URINE PREG, ED: Preg Test, Ur: NEGATIVE

## 2023-08-25 MED ORDER — DIPHENHYDRAMINE HCL 50 MG/ML IJ SOLN
25.0000 mg | Freq: Once | INTRAMUSCULAR | Status: AC
  Administered 2023-08-25: 25 mg via INTRAVENOUS
  Filled 2023-08-25: qty 1

## 2023-08-25 MED ORDER — CAFFEINE 200 MG PO TABS: 200.0000 mg | ORAL_TABLET | Freq: Four times a day (QID) | ORAL | 0 refills | Status: AC | PRN

## 2023-08-25 MED ORDER — PROCHLORPERAZINE EDISYLATE 10 MG/2ML IJ SOLN
10.0000 mg | Freq: Once | INTRAMUSCULAR | Status: AC
  Administered 2023-08-25: 10 mg via INTRAVENOUS
  Filled 2023-08-25: qty 2

## 2023-08-25 MED ORDER — MAGNESIUM SULFATE 2 GM/50ML IV SOLN
2.0000 g | Freq: Once | INTRAVENOUS | Status: AC
  Administered 2023-08-25: 2 g via INTRAVENOUS
  Filled 2023-08-25: qty 50

## 2023-08-25 MED ORDER — KETOROLAC TROMETHAMINE 15 MG/ML IJ SOLN
15.0000 mg | Freq: Once | INTRAMUSCULAR | Status: AC
  Administered 2023-08-25: 15 mg via INTRAVENOUS
  Filled 2023-08-25: qty 1

## 2023-08-25 MED ORDER — ACETAMINOPHEN 500 MG PO TABS
1000.0000 mg | ORAL_TABLET | Freq: Once | ORAL | Status: AC
  Administered 2023-08-25: 1000 mg via ORAL
  Filled 2023-08-25: qty 2

## 2023-08-25 MED ORDER — METOCLOPRAMIDE HCL 5 MG/ML IJ SOLN
10.0000 mg | Freq: Once | INTRAMUSCULAR | Status: AC
  Administered 2023-08-25: 10 mg via INTRAMUSCULAR
  Filled 2023-08-25: qty 2

## 2023-08-25 NOTE — ED Provider Notes (Signed)
[facility] Provider Note    Event Date/Time   First MD Initiated Contact with Patient 08/25/23 [phone]     (approximate)   History   Back Pain   HPI  Kylie Huerta is a 32 y.o. female   Past medical history of complex migraines, hypertension, anxiety and depression, recent discharge due to the left-sided weakness and vision changes with headache with extensive workup negative brain MRI, LP, CT angiogram and was discharged yesterday who presents to the emergency department with postural headache after LP.  Headache is worse when sitting up right or standing and relieved with laying flat.  This is different from her headache that was evaluated in the hospital earlier.  She has no new neurologic complaints.   External Medical Documents Reviewed: Discharge summary dated 08/23/2023 for headache and neurologic changes with workup negative as above      Physical Exam   Triage Vital Signs: ED Triage Vitals [08/24/23 2257]  Encounter Vitals Group     BP (!) 132/92     Systolic BP Percentile      Diastolic BP Percentile      Pulse Rate 88     Resp 19     Temp 97.8 F (36.6 C)     Temp Source Oral     SpO2 100 %     Weight      Height      Head Circumference      Peak Flow      Pain Score 10     Pain Loc      Pain Education      Exclude from Growth Chart     Most recent vital signs: Vitals:   08/24/23 2257 08/25/23 0258  BP: (!) 132/92 114/70  Pulse: 88 (!) 57  Resp: 19 18  Temp: 97.8 F (36.6 C) 98 F (36.7 C)  SpO2: 100% 100%    General: Awake, no distress.  CV:  Good peripheral perfusion.  Resp:  Normal effort.  Abd:  No distention.  Other:  The LP site appears normal.  No focal neurologic deficits moving all extremities no motor or sensory deficits.  Awake alert comfortable appearing when laying flat.   ED Results / Procedures / Treatments   Labs (all labs ordered are listed, but only abnormal results are displayed) Labs  Reviewed  BASIC METABOLIC PANEL - Abnormal; Notable for the following components:      Result Value   Glucose, Bld 102 (*)    All other components within normal limits  URINALYSIS, ROUTINE W REFLEX MICROSCOPIC - Abnormal; Notable for the following components:   Color, Urine YELLOW (*)    APPearance CLEAR (*)    All other components within normal limits  CBG MONITORING, ED - Abnormal; Notable for the following components:   Glucose-Capillary 105 (*)    All other components within normal limits  CBC  POC URINE PREG, ED  TROPONIN I (HIGH SENSITIVITY)     I ordered and reviewed the above labs they are notable for cell counts electrolytes within normal limits  EKG  ED ECG REPORT I, Pilar Jarvis, the attending physician, personally viewed and interpreted this ECG.   Date: 08/25/2023  EKG Time: 2301  Rate: 77  Rhythm: sinus  Axis: nl  Intervals:none  ST&T Change:  no stemi     PROCEDURES:  Critical Care performed: No  Procedures   MEDICATIONS ORDERED IN ED: Medications  metoCLOPramide (REGLAN) injection 10 mg (10 mg  Intramuscular Given 08/25/23 0042)  prochlorperazine (COMPAZINE) injection 10 mg (10 mg Intravenous Given 08/25/23 0237)  ketorolac (TORADOL) 15 MG/ML injection 15 mg (15 mg Intravenous Given 08/25/23 0238)  magnesium sulfate IVPB 2 g 50 mL (0 g Intravenous Stopped 08/25/23 0345)  acetaminophen (TYLENOL) tablet 1,000 mg (1,000 mg Oral Given 08/25/23 0238)  diphenhydrAMINE (BENADRYL) injection 25 mg (25 mg Intravenous Given 08/25/23 0238)    IMPRESSION / MDM / ASSESSMENT AND PLAN / ED COURSE  I reviewed the triage vital signs and the nursing notes.                                Patient's presentation is most consistent with acute presentation with potential threat to life or bodily function.  Differential diagnosis includes, but is not limited to, postural headache due to lumbar puncture, intracranial hypotension, considered but less likely ICH   The  patient is on the cardiac monitor to evaluate for evidence of arrhythmia and/or significant heart rate changes.  MDM:    Post LP headache in this patient with postural headache after LP performed just yesterday.  Looks well with no focal neurologic deficits to suggest stroke, ICH, so we will symptomatically manage now, and she understands to follow-up for blood patch if symptoms not resolving.       FINAL CLINICAL IMPRESSION(S) / ED DIAGNOSES   Final diagnoses:  Lumbar puncture headache  Postural headache     Rx / DC Orders   ED Discharge Orders          Ordered    caffeine 200 MG TABS tablet  Every 6 hours PRN        08/25/23 0228             Note:  This document was prepared using Dragon voice recognition software and may include unintentional dictation errors.    Pilar Jarvis, MD 08/25/23 [phone]

## 2023-08-25 NOTE — Discharge Instructions (Signed)
Take acetaminophen 650 mg and ibuprofen 400 mg every 6 hours for pain.  Take with food. Take caffeine as prescribed as this can help with post spinal tap headache.  Drink plenty of fluids to stay well-hydrated.  Talk to your doctor about epidural blood patch for persistent spinal tap related headache  Thank you for choosing [place] for your health care today!  Please see your primary doctor this week for a follow up appointment.   If you have any new, worsening, or unexpected symptoms call your doctor right away or come back to the emergency department for reevaluation.  It was my pleasure to care for you today.   Daneil Dan Modesto Charon, MD

## 2023-09-03 ENCOUNTER — Encounter: Payer: Self-pay | Admitting: Pediatrics

## 2023-09-03 ENCOUNTER — Ambulatory Visit: Payer: Managed Care, Other (non HMO) | Admitting: Pediatrics

## 2023-09-03 VITALS — BP 133/89 | HR 74 | Temp 98.6°F | Ht 68.0 in | Wt 258.0 lb

## 2023-09-03 DIAGNOSIS — R202 Paresthesia of skin: Secondary | ICD-10-CM

## 2023-09-03 DIAGNOSIS — Z7689 Persons encountering health services in other specified circumstances: Secondary | ICD-10-CM

## 2023-09-03 DIAGNOSIS — R519 Headache, unspecified: Secondary | ICD-10-CM

## 2023-09-03 DIAGNOSIS — Z1330 Encounter for screening examination for mental health and behavioral disorders, unspecified: Secondary | ICD-10-CM

## 2023-09-03 DIAGNOSIS — T782XXA Anaphylactic shock, unspecified, initial encounter: Secondary | ICD-10-CM | POA: Insufficient documentation

## 2023-09-03 DIAGNOSIS — T782XXS Anaphylactic shock, unspecified, sequela: Secondary | ICD-10-CM

## 2023-09-03 MED ORDER — EPINEPHRINE 0.3 MG/0.3ML IJ SOAJ: 0.3000 mg | INTRAMUSCULAR | 3 refills | Status: AC | PRN

## 2023-09-03 NOTE — Patient Instructions (Signed)
Good to meet you! Welcome to [facility]!  As your primary care doctor, I look forward to working with you to help you reach your health goals.  Please be aware of a couple of logistical items: - If you message me on mychart, it may take me 1-2 business days to get back to you. This is for non-urgent messaging.  - If you require urgent clinical attention, please call the clinic or present to urgent care/emergency room - If you have labs, I typically will send a message about them in 1-2 business days. - I am not here on Mondays, otherwise will be available from Tuesday-Friday during 8a-5pm.

## 2023-09-03 NOTE — Assessment & Plan Note (Addendum)
Daily episodes of left-sided facial droopiness, numbness in the left arm, and lip numbness for the past month lasting for several minutes at a time. No symptoms during our visit today. Symptoms are severe enough to affect work and daily activities. Patient has been previously hospitalized for these symptoms, with a differential diagnosis of TIA vs complex migraines. Was told by PCP she had conversion disorder. A neurology appointment is scheduled for further evaluation. Noted to have empty sella on imaging which they suspected during recent admission may be contributing to HTN and/or complex migraines. Discussed would need to rule these out and discuss with neurology before diagnosing with conversion disorder. Normal exam today.  -Will review records and consider starting a daily medication, possibly sumatriptan, to manage symptoms until the neurology appointment. -Plan to follow up with the patient via MyChart regarding the treatment plan.

## 2023-09-03 NOTE — Assessment & Plan Note (Signed)
Requesting refills. Has h/o nut allergy.

## 2023-09-03 NOTE — Progress Notes (Signed)
Establish Care Note  BP 133/89 (BP Location: Left Arm, Patient Position: Sitting, Cuff Size: Large)   Pulse 74   Temp 98.6 F (37 C) (Oral)   Ht 5\' 8"  (1.727 m)   Wt 258 lb (117 kg)   LMP 08/15/2023 (Exact Date)   SpO2 98%   BMI 39.23 kg/m    Subjective:    Patient ID: Kylie Huerta, female    DOB: 1991/05/09, 32 y.o.   MRN: [id number]  HPI: PERLEAN FLUD is a 32 y.o. female  Chief Complaint  Patient presents with   Establish Care   Conversion disorder     Would like to discuss, was recently diagnosed     Establishing care, the following was discussed today:  Discussed the use of AI scribe software for clinical note transcription with the patient, who gave verbal consent to proceed.  History of Present Illness   The patient, who recently relocated to the area, presents for her initial visit following a recent hospitalization. She was admitted due to symptoms of numbness in the left hand, left arm, left side of the face, nose, and tongue, which have been persisting for almost a month. The hospital suggested a diagnosis of TIA or complex headaches. However, during a subsequent virtual consultation with her previous primary care provider, the possibility of conversion disorder was raised.  The patient reports that these symptoms are occurring daily and are particularly noticeable in the mornings. She describes episodes of facial droopiness, particularly on the left side, and a sensation akin to lidocaine-induced numbness in the tongue. These episodes, which family members have also observed, last for a few minutes and are often followed by an overwhelming need to sleep. The patient also reports extreme fatigue, even after a full night's sleep.  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 She also has a history of hypertension, which was discussed in the context of a diagnosis of empty sella  syndrome during her recent hospitalization.  The patient is currently on aspirin and caffeine, the latter prescribed for a headache following a spinal tap. She also takes Zyrtec as needed. She was previously prescribed atorvastatin for high cholesterol, but is not currently taking it.  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 She expresses a desire to return to work at the beginning of the new year, depending on the outcome of her upcoming neurology appointment.      Current Outpatient Medications on File Prior to Visit  Medication Sig Dispense Refill   albuterol (VENTOLIN HFA) 108 (90 Base) MCG/ACT inhaler Inhale 2 puffs into the lungs every 6 (six) hours as needed for wheezing or shortness of breath. 8 g 0   aspirin 81 MG chewable tablet Chew 1 tablet (81 mg total) by mouth daily. 30 tablet 0   caffeine 200 MG TABS tablet Take 1 tablet (200 mg total) by mouth every 6 (six) hours as needed for up to 7 days. 20 tablet 0   cetirizine (ZYRTEC) 10 MG tablet Take 10 mg by mouth as needed.     hydrochlorothiazide (HYDRODIURIL) 12.5 MG tablet Take 1 tablet (12.5 mg total) by mouth daily. 30 tablet 2   lisinopril (ZESTRIL) 10 MG tablet Take 1 tablet (10 mg total) by mouth daily. 30 tablet 2   No current facility-administered medications on file prior to visit.   #  HM Will review HM records and updated as needed.  Relevant past medical, surgical, family and social history reviewed and updated as indicated. Interim medical history since our last visit reviewed. Allergies and medications reviewed and updated.  ROS per HPI unless specifically indicated above     Objective:    BP 133/89 (BP Location: Left Arm, Patient Position: Sitting, Cuff Size: Large)   Pulse 74   Temp 98.6 F (37 C) (Oral)   Ht 5\' 8"  (1.727 m)   Wt 258 lb (117 kg)   LMP 08/15/2023 (Exact Date)   SpO2 98%   BMI 39.23 kg/m   Wt Readings from Last 3  Encounters:  09/03/23 258 lb (117 kg)  08/21/23 253 lb (114.8 kg)  03/26/23 270 lb (122.5 kg)     Physical Exam Constitutional:      Appearance: Normal appearance.  HENT:     Head: Normocephalic and atraumatic.  Eyes:     Pupils: Pupils are equal, round, and reactive to light.  Cardiovascular:     Rate and Rhythm: Normal rate and regular rhythm.     Pulses: Normal pulses.     Heart sounds: Normal heart sounds.  Pulmonary:     Effort: Pulmonary effort is normal.     Breath sounds: Normal breath sounds.  Abdominal:     General: Abdomen is flat.     Palpations: Abdomen is soft.  Musculoskeletal:        General: Normal range of motion.     Cervical back: Normal range of motion.  Skin:    General: Skin is warm and dry.     Capillary Refill: Capillary refill takes less than 2 seconds.  Neurological:     General: No focal deficit present.     Mental Status: She is alert. Mental status is at baseline.  Psychiatric:        Mood and Affect: Mood normal.        Behavior: Behavior normal.         09/03/2023    8:33 AM 08/24/2022   12:27 PM  Depression screen PHQ 2/9  Decreased Interest 3   Down, Depressed, Hopeless 2   PHQ - 2 Score 5   Altered sleeping 3   Tired, decreased energy 3   Change in appetite 3   Feeling bad or failure about yourself  2   Trouble concentrating 3   Moving slowly or fidgety/restless 2   Suicidal thoughts 1   PHQ-9 Score 22   Difficult doing work/chores       Information is confidential and restricted. Go to Review Flowsheets to unlock data.        09/03/2023    8:33 AM 08/24/2022   12:27 PM  GAD 7 : Generalized Anxiety Score  Nervous, Anxious, on Edge 3   Control/stop worrying 3   Worry too much - different things 3   Trouble relaxing 3   Restless 3   Easily annoyed or irritable 3   Afraid - awful might happen 3   Total GAD 7 Score 21   Anxiety Difficulty       Information is confidential and restricted. Go to Review Flowsheets  to unlock data.       Assessment & Plan:  Assessment & Plan   Encounter to establish care Reviewed patient record including history, medications, problem list. HM updated as able. Will bring records and will fill HM gaps as needed at follow up visit.  Headache disorder Paresthesias Assessment &  Plan: Daily episodes of left-sided facial droopiness, numbness in the left arm, and lip numbness for the past month lasting for several minutes at a time. No symptoms during our visit today. Symptoms are severe enough to affect work and daily activities. Patient has been previously hospitalized for these symptoms, with a differential diagnosis of TIA vs complex migraines. Was told by PCP she had conversion disorder. A neurology appointment is scheduled for further evaluation. Noted to have empty sella on imaging which they suspected during recent admission may be contributing to HTN and/or complex migraines. Normal exam today though reports no symptoms. Discussed would need to rule these out and discuss with neurology before diagnosing with conversion disorder. Offered reassurance of validity of symptoms. -Will review records and consider starting a daily medication, possibly sumatriptan, to manage symptoms until the neurology appointment. -Plan to follow up with the patient via MyChart regarding the treatment plan.   Anaphylaxis, sequela Assessment & Plan: Requesting refills. Has h/o nut allergy.  Orders: -     EPINEPHrine; Inject 0.3 mg into the muscle as needed for anaphylaxis.  Dispense: 1 each; Refill: 3  Encounter for behavioral health screening As part of their intake evaluation, the patient was screened for depression, anxiety.  PHQ9 SCORE 22, GAD7 SCORE 21. Screening results positive for tested conditions. Previously on treatment for multiple mood disorders. Plan to revisit at follow up visit. No acute safety concerns at this time.  Follow up plan: Return in about 6 weeks (around  10/15/2023) for Physical, pap.  Regan Mcbryar Howell Pringle, MD

## 2023-09-04 ENCOUNTER — Telehealth: Payer: Self-pay | Admitting: Pediatrics

## 2023-09-04 ENCOUNTER — Encounter: Payer: Self-pay | Admitting: Pediatrics

## 2023-09-04 NOTE — Telephone Encounter (Signed)
Patient dropped off document FMLA, to be filled out by provider. Patient requested to send it back via Call Patient to pick up within 5-days. Document is located in providers tray at front office.Please advise at Mobile [phone] (mobile)   Patient came by to pick up FMLA forms. She stated there is a discrepancy in the dates printed. Patient wrote dates down I am forwarding form to provider to update.

## 2023-09-11 ENCOUNTER — Telehealth: Payer: Self-pay | Admitting: Pediatrics

## 2023-09-11 NOTE — Telephone Encounter (Signed)
Patient aware paperwork completed, faxed to her HR department and available to pickup from the front desk.

## 2023-09-11 NOTE — Telephone Encounter (Signed)
Copied from CRM [id number]. Topic: General - Other >> Sep 11, 2023 10:54 AM Turkey B wrote: Reason for CRM: [place] Citizen from [employer],called in , about fx she received from [place] , regarding leave of absence from her job. Please cb

## 2023-09-11 NOTE — Telephone Encounter (Signed)
Patient stopped into the office to discuss. Corrections are now pending with provider.

## 2023-09-11 NOTE — Telephone Encounter (Signed)
Copied from CRM [id number]. Topic: General - Inquiry >> Sep 10, 2023 12:56 PM Payton Doughty wrote: Reason for CRM: pt following up on paperwork Dr Evelene Croon needs to sign.  She dropped off a correction page last Wed. (left w/ receptionist and paper was put in a gray box   .this needs to be corrected by the dr. Her deadline for this paperwork is due 12/07.  Pt would like to know something asap.

## 2023-10-08 ENCOUNTER — Ambulatory Visit (INDEPENDENT_AMBULATORY_CARE_PROVIDER_SITE_OTHER): Payer: Managed Care, Other (non HMO) | Admitting: Diagnostic Neuroimaging

## 2023-10-08 ENCOUNTER — Encounter: Payer: Self-pay | Admitting: Diagnostic Neuroimaging

## 2023-10-08 VITALS — BP 212/120 | HR 77 | Ht 68.0 in | Wt 255.0 lb

## 2023-10-08 DIAGNOSIS — R519 Headache, unspecified: Secondary | ICD-10-CM | POA: Diagnosis not present

## 2023-10-08 DIAGNOSIS — G43E01 Chronic migraine with aura, not intractable, with status migrainosus: Secondary | ICD-10-CM

## 2023-10-08 DIAGNOSIS — R0681 Apnea, not elsewhere classified: Secondary | ICD-10-CM | POA: Diagnosis not present

## 2023-10-08 DIAGNOSIS — G4719 Other hypersomnia: Secondary | ICD-10-CM

## 2023-10-08 DIAGNOSIS — I10 Essential (primary) hypertension: Secondary | ICD-10-CM | POA: Diagnosis not present

## 2023-10-08 MED ORDER — TOPIRAMATE 50 MG PO TABS: 50.0000 mg | ORAL_TABLET | Freq: Two times a day (BID) | ORAL | 12 refills | Status: DC

## 2023-10-08 MED ORDER — NURTEC 75 MG PO TBDP: 75.0000 mg | ORAL_TABLET | Freq: Every day | ORAL | 6 refills | Status: AC | PRN

## 2023-10-08 NOTE — Patient Instructions (Signed)
  MIGRAINE WITH AURA (complicated migraine)  MIGRAINE PREVENTION  LIFESTYLE CHANGES -Stop or avoid smoking -Decrease or avoid caffeine  / alcohol -Eat and sleep on a regular schedule -Exercise several times per week - start topiramate  50mg  at bedtime; after 1-2 weeks increase to 50mg  twice a day; drink plenty of water  MIGRAINE RESCUE  - ibuprofen , tylenol  as needed - avoid triptans (due to complicated migraine and possible TIA) - rimegepant (Nurtec) 75mg  as needed for breakthrough headache; max 8 per month   SLEEP APNEA / SLEEP STUDY --> refer to consult and study

## 2023-10-08 NOTE — Progress Notes (Signed)
 [facility]  PATIENT: Kylie Huerta DOB: [birth year]  REFERRING CLINICIAN: Juliane Che, PA HISTORY FROM: patient  REASON FOR VISIT: new consult   HISTORICAL  CHIEF COMPLAINT:  Chief Complaint  Patient presents with   New Patient (Initial Visit)    Rm6, alone, Outside referral from [facility] [phone] to be seen qnm:yzjijryzd/fphmjpwzd(ijpob, worse at times. Pt feels pressure all in back of head and in sinus area, experiences the aura)  s/p LP, possible IIIH, recent TIA (08/22/23, pt stated has left hand tingly & numbness radiating up through arm and left side of face causing muscle spasms. Pt reported had spinal tap but since then ha's have increased in frequency and intensity    HISTORY OF PRESENT ILLNESS:   32 year old female here for evaluation of headaches.  Patient has had headaches since childhood, worse in puberty, then improving.  She describes global frontal and occipital severe headaches associated with blurred vision, seeing spots and sparkles, nausea, sensitive to light and sound.  Denies any transient visual obscuration.  Headaches have been worsening in the last few months.  November patient had 2 weeks of left face and arm tingling sensations, worsening headaches, went to the emergency room for evaluation.  MRI, MRV, lumbar puncture were obtained and unremarkable.  She was diagnosed with complicated migraine.  Opening pressure was 22 cmH2O on LP, felt to be within normal limits.  Since that time patient continues to have headaches.  Headaches are almost daily basis.  She was taking ibuprofen  up to 4 times per day.  She has not tried migraine medications.  Patient does have issues with snoring, waking up gasping for air, possible apnea, and she has concerns for sleep apnea.  Patient does note that headaches tend to be worse when she wakes up from sleep.   REVIEW OF SYSTEMS: Full 14 system review of systems performed and  negative with exception of: AS PER HPI.  ALLERGIES: Allergies  Allergen Reactions   Almond Oil Shortness Of Breath and Swelling   Other Shortness Of Breath and Swelling    Walnuts, Tree nuts    Tree Extract Anaphylaxis and Swelling   Bee Pollen Hives   Kiwi Extract Hives   Pollen Extract     HOME MEDICATIONS: Outpatient Medications Prior to Visit  Medication Sig Dispense Refill   albuterol  (VENTOLIN  HFA) 108 (90 Base) MCG/ACT inhaler Inhale 2 puffs into the lungs every 6 (six) hours as needed for wheezing or shortness of breath. 8 g 0   aspirin  81 MG chewable tablet Chew 1 tablet (81 mg total) by mouth daily. 30 tablet 0   caffeine  200 MG TABS tablet Take 1 tablet (200 mg total) by mouth every 6 (six) hours as needed for up to 7 days. 20 tablet 0   cetirizine (ZYRTEC) 10 MG tablet Take 10 mg by mouth as needed.     EPINEPHrine  0.3 mg/0.3 mL IJ SOAJ injection Inject 0.3 mg into the muscle as needed for anaphylaxis. 1 each 3   hydrochlorothiazide  (HYDRODIURIL ) 12.5 MG tablet Take 1 tablet (12.5 mg total) by mouth daily. 30 tablet 2   lisinopril  (ZESTRIL ) 10 MG tablet Take 1 tablet (10 mg total) by mouth daily. 30 tablet 2   No facility-administered medications prior to visit.    PAST MEDICAL HISTORY: 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    PAST SURGICAL HISTORY:  Past Surgical History:  Procedure Laterality Date   CESAREAN SECTION      FAMILY HISTORY: Family History  Problem Relation Age of Onset   Drug abuse Mother    Bipolar disorder Mother    Other Mother        brain tumor   Kidney disease Mother    Kidney failure Father    Drug abuse Brother    Drug abuse Brother    Drug abuse Brother    Bipolar disorder Maternal Aunt    Bipolar disorder Maternal Grandmother    Bipolar disorder Paternal Grandmother    Suicidality Cousin     SOCIAL HISTORY: Social History   Socioeconomic History    Marital status: Single    Spouse name: Not on file   Number of children: 2   Years of education: Not on file   Highest education level: Associate degree: occupational, scientist, product/process development, or vocational program  Occupational History   Not on file  Tobacco Use   Smoking status: Never    Passive exposure: Never   Smokeless tobacco: Never  Vaping Use   Vaping status: Never Used  Substance and Sexual Activity   Alcohol use: Not Currently   Drug use: Yes    Types: Marijuana, Cocaine    Comment: cocaine 3 years ago   Sexual activity: Yes    Birth control/protection: None  Other Topics Concern   Not on file  Social History Narrative   Not on file   Social Drivers of Health   Financial Resource Strain: Low Risk  (03/29/2023)   Received from [facility], [facility]   Overall Financial Resource Strain (CARDIA)    Difficulty of Paying Living Expenses: Not hard at all  Food Insecurity: No Food Insecurity (08/22/2023)   Hunger Vital Sign    Worried About Running Out of Food in the Last Year: Never true    Ran Out of Food in the Last Year: Never true  Transportation Needs: No Transportation Needs (08/22/2023)   PRAPARE - Administrator, Civil Service (Medical): No    Lack of Transportation (Non-Medical): No  Physical Activity: Unknown (03/29/2023)   [facility]   Exercise Vital Sign    Days of Exercise per Week: 0 days    Minutes of Exercise per Session: Not on file  Stress: No Stress Concern Present (03/29/2023)   Received from [facility] Occupational Health - Occupational Stress Questionnaire    Feeling of Stress : Not at all  Social Connections: Moderately Integrated (03/29/2023)   Received from [facility], [facility]   Social Network    How would you rate your social network (family, work, friends)?: Adequate participation with social networks  Intimate Partner Violence: Not At Risk (08/22/2023)    Humiliation, Afraid, Rape, and Kick questionnaire    Fear of Current or Ex-Partner: No    Emotionally Abused: No    Physically Abused: No    Sexually Abused: No     PHYSICAL EXAM  GENERAL EXAM/CONSTITUTIONAL: Vitals:  Vitals:   10/08/23 1042 10/08/23 1054  BP: (!) 189/129 (!) 212/120  Pulse: 77   Weight: 255 lb (115.7 kg)   Height: 5' 8 (1.727 m)    Body mass index is 38.77 kg/m. Wt Readings from Last 3 Encounters:  10/08/23 255 lb (115.7 kg)  09/03/23 258 lb (117 kg)  08/21/23 253 lb (114.8 kg)   Patient is in no distress; well  developed, nourished and groomed; neck is supple  CARDIOVASCULAR: Examination of carotid arteries is normal; no carotid bruits Regular rate and rhythm, no murmurs Examination of peripheral vascular system by observation and palpation is normal  EYES: Ophthalmoscopic exam of optic discs and posterior segments is normal; no papilledema or hemorrhages No results found.  MUSCULOSKELETAL: Gait, strength, tone, movements noted in Neurologic exam below  NEUROLOGIC: MENTAL STATUS:      No data to display         awake, alert, oriented to person, place and time recent and remote memory intact normal attention and concentration language fluent, comprehension intact, naming intact fund of knowledge appropriate  CRANIAL NERVE:  2nd - no papilledema on fundoscopic exam 2nd, 3rd, 4th, 6th - pupils equal and reactive to light, visual fields full to confrontation, extraocular muscles intact, no nystagmus 5th - facial sensation symmetric 7th - facial strength symmetric 8th - hearing intact 9th - palate elevates symmetrically, uvula midline 11th - shoulder shrug symmetric 12th - tongue protrusion midline  MOTOR:  normal bulk and tone, full strength in the BUE, BLE  SENSORY:  normal and symmetric to light touch, temperature, vibration  COORDINATION:  finger-nose-finger, fine finger movements normal  REFLEXES:  deep tendon reflexes  present and symmetric  GAIT/STATION:  narrow based gait     DIAGNOSTIC DATA (LABS, IMAGING, TESTING) - I reviewed patient records, labs, notes, testing and imaging myself where available.  Lab Results  Component Value Date   WBC 6.1 08/24/2023   HGB 13.5 08/24/2023   HCT 41.5 08/24/2023   MCV 83.7 08/24/2023   PLT 295 08/24/2023      Component Value Date/Time   NA 137 08/24/2023 2306   K 3.7 08/24/2023 2306   CL 105 08/24/2023 2306   CO2 25 08/24/2023 2306   GLUCOSE 102 (H) 08/24/2023 2306   BUN 11 08/24/2023 2306   CREATININE 0.97 08/24/2023 2306   CALCIUM  9.1 08/24/2023 2306   PROT 7.5 08/21/2023 1816   ALBUMIN 4.0 08/21/2023 1816   AST 16 08/21/2023 1816   ALT 14 08/21/2023 1816   ALKPHOS 51 08/21/2023 1816   BILITOT <0.2 08/21/2023 1816   GFRNONAA >60 08/24/2023 2306   GFRAA >60 05/26/2019 2343   Lab Results  Component Value Date   CHOL 226 (H) 08/23/2023   HDL 52 08/23/2023   LDLCALC 152 (H) 08/23/2023   TRIG 109 08/23/2023   CHOLHDL 4.3 08/23/2023   Lab Results  Component Value Date   HGBA1C 5.4 08/21/2023   Lab Results  Component Value Date   VITAMINB12 233 08/23/2023   No results found for: TSH    ASSESSMENT AND PLAN  32 y.o. year old female here with:   Dx:  1. Worsening headaches   2. Apnea   3. Excessive daytime sleepiness   4. Essential hypertension   5. 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, not intractable     PLAN:  MIGRAINE WITH AURA (complicated migraine)  MIGRAINE PREVENTION  LIFESTYLE CHANGES -Stop or avoid smoking -Decrease or avoid caffeine  / alcohol -Eat and sleep on a regular schedule -Exercise several times per week - start topiramate  50mg  at bedtime; after 1-2 weeks increase to 50mg  twice a day; drink plenty of water  Consider 2nd line - rimegepant (Nurtec) 75mg  every other day - atogepant (Qulipta) 60mg  daily - erenumab (Aimovig) 70mg  monthly (may increase to 140mg  monthly) -  fremanezumab (Ajovy) 225mg  monthly (or 675mg  every 3 months) - galazanezumab (Emgality) 240mg  loading  dose; then 120mg  monthly   MIGRAINE RESCUE  - ibuprofen , tylenol  as needed - avoid triptans (due to complicated migraine and possible TIA) - rimegepant (Nurtec) 75mg  as needed for breakthrough headache; max 8 per month   SLEEP APNEA EVALUATION (hypertension, incr BMI, apnea, early AM headaches)  --> refer to sleep consult   Orders Placed This Encounter  Procedures   Ambulatory referral to Sleep Studies   Meds ordered this encounter  Medications   topiramate  (TOPAMAX ) 50 MG tablet    Sig: Take 1 tablet (50 mg total) by mouth 2 (two) times daily.    Dispense:  60 tablet    Refill:  12   Rimegepant Sulfate (NURTEC) 75 MG TBDP    Sig: Take 1 tablet (75 mg total) by mouth daily as needed.    Dispense:  8 tablet    Refill:  6    Return in about 6 months (around 04/06/2024) for MyChart visit (15 min).    EDUARD FABIENE HANLON, MD 10/08/2023, 11:17 AM Certified in Neurology, Neurophysiology and Neuroimaging  [facility] [address], [address] [place], [place] [address] [phone]

## 2023-10-15 ENCOUNTER — Ambulatory Visit (INDEPENDENT_AMBULATORY_CARE_PROVIDER_SITE_OTHER): Payer: Managed Care, Other (non HMO) | Admitting: Pediatrics

## 2023-10-15 ENCOUNTER — Encounter: Payer: Self-pay | Admitting: Pediatrics

## 2023-10-15 VITALS — BP 150/101 | HR 79 | Temp 97.9°F | Resp 14 | Wt 252.4 lb

## 2023-10-15 DIAGNOSIS — Z1330 Encounter for screening examination for mental health and behavioral disorders, unspecified: Secondary | ICD-10-CM | POA: Diagnosis not present

## 2023-10-15 DIAGNOSIS — Z0000 Encounter for general adult medical examination without abnormal findings: Secondary | ICD-10-CM | POA: Diagnosis not present

## 2023-10-15 DIAGNOSIS — Z6838 Body mass index (BMI) 38.0-38.9, adult: Secondary | ICD-10-CM

## 2023-10-15 DIAGNOSIS — I10 Essential (primary) hypertension: Secondary | ICD-10-CM

## 2023-10-15 DIAGNOSIS — F419 Anxiety disorder, unspecified: Secondary | ICD-10-CM | POA: Diagnosis not present

## 2023-10-15 DIAGNOSIS — R519 Headache, unspecified: Secondary | ICD-10-CM

## 2023-10-15 DIAGNOSIS — F32A Depression, unspecified: Secondary | ICD-10-CM

## 2023-10-15 DIAGNOSIS — Z7689 Persons encountering health services in other specified circumstances: Secondary | ICD-10-CM

## 2023-10-15 MED ORDER — WEGOVY 0.25 MG/0.5ML ~~LOC~~ SOAJ: 0.2500 mg | SUBCUTANEOUS | 0 refills | Status: DC

## 2023-10-15 MED ORDER — OLMESARTAN MEDOXOMIL 20 MG PO TABS: 20.0000 mg | ORAL_TABLET | Freq: Every day | ORAL | 2 refills | Status: DC

## 2023-10-15 NOTE — Progress Notes (Addendum)
 BP (!) 150/101 (BP Location: Left Arm, Patient Position: Sitting, Cuff Size: Large)   Pulse 79   Temp 97.9 F (36.6 C) (Oral)   Resp 14   Wt 252 lb 6.4 oz (114.5 kg)   LMP 10/08/2023 (Exact Date)   SpO2 99%   BMI 38.38 kg/m    Annual Physical Exam - Female  Subjective:   CC: Annual Exam (Weight, has lost 3 pounds) and FMLA (Has her paperwork)   Kylie Huerta is a 33 y.o. female patient here for a preventative health maintenance exam and has no acute complaints.  Health Habits: DIET: in general, an unhealthy diet EXERCISE: walks multiple times a week in neighborhood  DENTAL EXAM: Up to Date EYE EXAM: Up to Date                       Relevant Gynecologic History LMP: Patient's last menstrual period was 10/08/2023 (exact date).  Menstrual Status: premenopausal, Flow regular every month without intermenstrual spotting PAP History:  Result Date Procedure Results Follow-ups  06/06/2021 HM PAP SMEAR HM Pap smear: NILM Chlamydia, Swab/Urine, PCR: NEG   02/29/2016 Cytology - PAP CYTOLOGY - PAP: PAP RESULT     History abnormal PAP: No  Sexual activity: not sexually active Family history breast, ovarian cancer: No Domestic Violence Screen, feels safe at home: Yes  family history includes Bipolar disorder in her maternal aunt, maternal grandmother, mother, and paternal grandmother; Drug abuse in her brother, brother, brother, and mother; Kidney disease in her mother; Kidney failure in her father; Other in her mother; Suicidality in her cousin.  Social History   Tobacco Use   Smoking status: Never    Passive exposure: Never   Smokeless tobacco: Never  Vaping Use   Vaping status: Never Used  Substance Use Topics   Alcohol use: Not Currently   Drug use: Yes    Types: Marijuana, Cocaine    Comment: cocaine 3 years ago   Social History   Social History Narrative   Not on file   Social drivers questionnaire is reviewed and is positive for : none  Depression  Screening:     10/15/2023   11:12 AM 09/03/2023    8:33 AM 08/24/2022   12:27 PM  Depression screen PHQ 2/9  Decreased Interest 1 3   Down, Depressed, Hopeless 1 2   PHQ - 2 Score 2 5   Altered sleeping 2 3   Tired, decreased energy 1 3   Change in appetite 3 3   Feeling bad or failure about yourself  1 2   Trouble concentrating 1 3   Moving slowly or fidgety/restless 0 2   Suicidal thoughts 0 1   PHQ-9 Score 10 22   Difficult doing work/chores Very difficult       Information is confidential and restricted. Go to Review Flowsheets to unlock data.       10/15/2023   11:13 AM 09/03/2023    8:33 AM 08/24/2022   12:27 PM  GAD 7 : Generalized Anxiety Score  Nervous, Anxious, on Edge 2 3   Control/stop worrying 2 3   Worry too much - different things 2 3   Trouble relaxing 2 3   Restless 2 3   Easily annoyed or irritable 3 3   Afraid - awful might happen 2 3   Total GAD 7 Score 15 21   Anxiety Difficulty Very difficult       Information is confidential  and restricted. Go to Review Flowsheets to unlock data.    Mental Health Plan: continue to monitor. Following with psych.  Self Management Goals  Goals   None     Health Maintenance Colon Cancer Screening : Not applicable Mammogram : Not applicable DXA scan : Not applicable Immunizations : up to date and documented  Review of Systems See HPI for relevant ROS.  Outpatient Medications Prior to Visit  Medication Sig Dispense Refill   albuterol  (VENTOLIN  HFA) 108 (90 Base) MCG/ACT inhaler Inhale 2 puffs into the lungs every 6 (six) hours as needed for wheezing or shortness of breath. 8 g 0   aspirin  81 MG chewable tablet Chew 1 tablet (81 mg total) by mouth daily. 30 tablet 0   buPROPion (WELLBUTRIN XL) 150 MG 24 hr tablet Take 150 mg by mouth daily.     caffeine  200 MG TABS tablet Take 1 tablet (200 mg total) by mouth every 6 (six) hours as needed for up to 7 days. 20 tablet 0   cetirizine (ZYRTEC) 10 MG tablet  Take 10 mg by mouth as needed.     EPINEPHrine  0.3 mg/0.3 mL IJ SOAJ injection Inject 0.3 mg into the muscle as needed for anaphylaxis. 1 each 3   gabapentin (NEURONTIN) 100 MG capsule Take 100 mg by mouth 3 (three) times daily.     lamoTRIgine (LAMICTAL) 25 MG tablet Take 25 mg by mouth daily.     Rimegepant Sulfate (NURTEC) 75 MG TBDP Take 1 tablet (75 mg total) by mouth daily as needed. 8 tablet 6   topiramate  (TOPAMAX ) 50 MG tablet Take 1 tablet (50 mg total) by mouth 2 (two) times daily. 60 tablet 12   hydrochlorothiazide  (HYDRODIURIL ) 12.5 MG tablet Take 1 tablet (12.5 mg total) by mouth daily. 30 tablet 2   lisinopril  (ZESTRIL ) 10 MG tablet Take 1 tablet (10 mg total) by mouth daily. 30 tablet 2   No facility-administered medications prior to visit.     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5 kg)   SpO2: 99%   TempSrc: Oral   BMI (Calculated): 38.39     Body mass index is 38.38 kg/m.  Physical Exam Constitutional:      Appearance: Normal appearance.  HENT:     Head: Normocephalic and atraumatic.  Eyes:     Pupils: Pupils are equal, round, and reactive to light.  Cardiovascular:     Rate and Rhythm: Normal rate and regular rhythm.     Pulses: Normal pulses.     Heart sounds: Normal heart  sounds.  Pulmonary:     Effort: Pulmonary effort is normal.     Breath sounds: Normal breath sounds.  Abdominal:     General: Abdomen is flat.     Palpations: Abdomen is soft.  Musculoskeletal:        General: Normal range of motion.     Cervical back: Normal range of motion.  Skin:    General: Skin is warm and dry.     Capillary Refill: Capillary refill takes less than 2 seconds.  Neurological:     General: No focal deficit present.     Mental Status: She is alert. Mental status is at baseline.  Psychiatric:        Mood and Affect: Mood normal.        Behavior: Behavior normal.    Assessment and Plan:   Annual physical exam Discussed lifestyle modifications and goals including plant based eating styles (such as: Mediterranean eating style), regular exercise (at least 150 min of moderate-intensity aerobic exercise per week, given AHA workout handout), get adequate sleep, and continue working with PCP towards meeting health goals to ensure healthy aging.   Essential hypertension Assessment & Plan: Poorly controlled. Do suspect some underlying sleep apnea contributing (sleep study referral already placed by neurology). Plan to d/c lisinopril  and hydrochlorothiazide , switch to olmesatan 20mg  for longer acting coverage. Will also r/o hyperaldo given prior low K and poorly controlled BP.   Orders: -     Olmesartan  Medoxomil; Take 1 tablet (20 mg total) by mouth daily.  Dispense: 30 tablet; Refill: 2 -     Aldosterone + renin activity w/ ratio -     Basic metabolic panel  Encounter for weight management BMI 38.0-38.9,adult Assessment & Plan: Plan to try for wegovy  coverage. She is open to exploring bariatric surgery in medication options failed. Consider increasing topiramate  for night time cravings at follow up.   Clinical coverage for weight loss GLP's  Medication being dispensed is Wegovy  3 mL/28 day. Titration doses are 2 mL/28 days.   [x]  Product being prescribed is  FDA approved for the indication, age, weight (if applicable) and not does not exceed dosing limits per the Prescribing Information per the clinical conditions for use.  [x]  Patient's baseline weight measured within the last 45 days as required by provider before dispensing.  [x]  Patient is new to therapy and One of the following:  [x]  The beneficiary is 33 years of age or over and has ONE of the following:  [x]  A BMI greater than or equal to 30 kg/m2  []  A BMI greater than or equal to 27 kg/m2 with at least one weight-related comorbidity/risk factor/complication (i.e. hypertension, type 2 diabetes, obstructive sleep apnea, cardiovascular disease, dyslipidemia)   If patient has one weight-related comorbidity/risk factor/complication (i.e. hypertension, type 2 diabetes, obstructive sleep apnea, cardiovascular disease, dyslipidemia), please list Sleep apnea, HTN. Patient suffers from weight-related comorbidity/risk factor/complication  []  The beneficiary is 75 - 70 years of age and has ONE of the following:  []  A BMI greater than or equal to the 95th percentile for age and sex  []  A BMI greater than or equal to 30 kg/m2  [x]  A BMI greater than or equal to the 85th percentile for age and sex AND at least one severe weight-related comorbidity/risk factor/complication (i.e. hypertension, type 2 diabetes, obstructive sleep apnea, cardiovascular disease, dyslipidemia)  Last weight recorded  Wt Readings from Last 3 Encounters:  10/15/23 252 lb 6.4 oz (114.5 kg)  10/08/23 255 lb (115.7 kg)  09/03/23 258 lb (117 kg)    Last BMI recorded Estimated body mass index is 38.38 kg/m as calculated from the following:   Height as of 10/08/23: 5' 8 (1.727 m).   Weight  as of this encounter: 252 lb 6.4 oz (114.5 kg).   Headache disorder Assessment & Plan: Following with neurology. Current regimen includes nurtec, topamax . Improvement in headaches. Suspect will improve with HTN management as well.  Neuro working up sleep apnea as well.    Anxiety and depression Assessment & Plan: Following with psych. Currently on welbutrin 150, lamictal 25mg  and gabapentin 100mg  TID. Will obtain records and clarify all psych diagnoses on chart.   Encounter for behavioral health screening As part of their intake evaluation, the patient was screened for depression, anxiety.  PHQ9 SCORE 10, GAD7 SCORE 15. Screening results positive for tested conditions. See plan under problem/diagnosis above.  This plan was discussed with the patient and questions were answered. There were no further concerns.  Follow up as indicated, or sooner should any new problems arise, if conditions worsen, or if they are otherwise concerned.   See patient instructions for additional information.  Dakwon Wenberg SHAUNNA NETT, MD  Family Medicine      Future Appointments  Date Time Provider Department Center  10/29/2023  9:40 AM Nett Hadassah SHAUNNA, MD [facility]  04/06/2024  1:45 PM Penumalli, Eduard SAUNDERS, MD [facility] None

## 2023-10-15 NOTE — Assessment & Plan Note (Signed)
 Poorly controlled. Do suspect some underlying sleep apnea contributing (sleep study referral already placed by neurology). Plan to d/c lisinopril  and hydrochlorothiazide , switch to olmesatan 20mg  for longer acting coverage. Will also r/o hyperaldo given prior low K and poorly controlled BP.

## 2023-10-15 NOTE — Assessment & Plan Note (Signed)
 Following with psych. Currently on welbutrin 150, lamictal 25mg  and gabapentin 100mg  TID. Will obtain records and clarify all psych diagnoses on chart.

## 2023-10-15 NOTE — Assessment & Plan Note (Signed)
 Plan to try for wegovy coverage. She is open to exploring bariatric surgery in medication options failed. Consider increasing topiramate for night time cravings at follow up.

## 2023-10-15 NOTE — Assessment & Plan Note (Signed)
 Following with neurology. Current regimen includes nurtec, topamax. Improvement in headaches. Suspect will improve with HTN management as well. Neuro working up sleep apnea as well.

## 2023-10-15 NOTE — Patient Instructions (Addendum)
 I'll message you about wegovy  coverage  Measure your blood pressure at home! Stop lisinopril  and hydrochlorothiazide . We are switching to olmesartan  20mg . Message me if after 3 days you are getting readings >140/90 Home Blood Pressure Monitoring is an important part of managing blood pressure and thought to be more accurate than the measures we get in the clinic.  Here's some tips on how to take your blood pressure accurately at home and some highly rated monitors. Most insurances (except for Medicaid) won't pay for monitors, so unfortunately they are an out-of-pocket expense for most people.  Taking an accurate blood pressure measurement: To get an accurate blood pressure reading, empty your bladder first, then rest in a seated position for at least 5 minutes. Ideally, no caffeine  or tobacco use in last 30 minutes. Use an arm cuff (not wrist - see recommendations below) seated in a chair with a back next to a table or object that is high enough that you can rest your arm so the blood pressure cuff is at the level of your heart and you can lean back comfortably. Keep both feet on the floor and don't talk while the machine is working. Checking at different times of the day can be helpful to get an idea of your average numbers. Your goal blood pressure should be <140/90. BP Monitor Ratings: Here are some top rated Blood Pressure Monitors as tested by Consumer Reports (accessed January, 2024): (*BB) Best buy = highly rated with lower price Rating Brand/Model Estimated Cost  86 [employer]ron Platinum BP5450 $79  85 Omron Silver BP5250 (*BB)  $53  84 Omron 10 Series BP7450 $92  83 Omron Evolv BP7000 $110  81 [employer] Medical [id number] $52  80 Omron 3 Series BP7100 $50  78 [employer]Health KN550BT $40  76 Up & Up ([employer]) Automatic Upper Arm 48-554 $30  Note: all units listed above are arm cuffs which are more accurate than wrist cuffs. For more information (and the source of the below info-graphic on how to get set up to get  an accurate reading) - check out: superiormarketers.be

## 2023-10-18 NOTE — Telephone Encounter (Signed)
 Paperwork pending for review and signature in providers folder.

## 2023-10-21 LAB — BASIC METABOLIC PANEL
BUN/Creatinine Ratio: 13 (ref 9–23)
BUN: 13 mg/dL (ref 6–20)
CO2: 23 mmol/L (ref 20–29)
Calcium: 9.7 mg/dL (ref 8.7–10.2)
Chloride: 104 mmol/L (ref 96–106)
Creatinine, Ser: 1.02 mg/dL — ABNORMAL HIGH (ref 0.57–1.00)
Glucose: 81 mg/dL (ref 70–99)
Potassium: 3.9 mmol/L (ref 3.5–5.2)
Sodium: 141 mmol/L (ref 134–144)
eGFR: 75 mL/min/{1.73_m2} (ref >59)

## 2023-10-21 LAB — ALDOSTERONE + RENIN ACTIVITY W/ RATIO
Aldos/Renin Ratio: 8.6 (ref 0.0–30.0)
Aldosterone: 5.9 ng/dL (ref 0.0–30.0)
Renin Activity, Plasma: 0.686 ng/mL/h (ref 0.167–5.380)

## 2023-10-24 ENCOUNTER — Telehealth: Payer: Self-pay

## 2023-10-24 ENCOUNTER — Other Ambulatory Visit (HOSPITAL_COMMUNITY): Payer: Self-pay

## 2023-10-24 NOTE — Telephone Encounter (Signed)
*  [facility]  Pharmacy Patient Advocate Encounter  Received notification from [employer] that Prior Authorization for Nurtec 75MG  dispersible tablets  has been APPROVED from 10/24/2023 to 10/22/2024. Ran test claim, Copay is $0.00. This test claim was processed through [facility]- copay amounts may vary at other pharmacies due to pharmacy/plan contracts, or as the patient moves through the different stages of their insurance plan.   PA #/Case ID/Reference #: [id number]

## 2023-10-29 ENCOUNTER — Ambulatory Visit (INDEPENDENT_AMBULATORY_CARE_PROVIDER_SITE_OTHER): Payer: Managed Care, Other (non HMO) | Admitting: Pediatrics

## 2023-10-29 ENCOUNTER — Other Ambulatory Visit: Payer: Self-pay

## 2023-10-29 VITALS — BP 135/88 | HR 75 | Temp 98.4°F | Resp 15 | Wt 254.8 lb

## 2023-10-29 DIAGNOSIS — Z6838 Body mass index (BMI) 38.0-38.9, adult: Secondary | ICD-10-CM

## 2023-10-29 DIAGNOSIS — R519 Headache, unspecified: Secondary | ICD-10-CM

## 2023-10-29 DIAGNOSIS — Z1330 Encounter for screening examination for mental health and behavioral disorders, unspecified: Secondary | ICD-10-CM | POA: Diagnosis not present

## 2023-10-29 DIAGNOSIS — Z7689 Persons encountering health services in other specified circumstances: Secondary | ICD-10-CM

## 2023-10-29 DIAGNOSIS — I10 Essential (primary) hypertension: Secondary | ICD-10-CM | POA: Diagnosis not present

## 2023-10-29 MED ORDER — WEGOVY 0.25 MG/0.5ML ~~LOC~~ SOAJ: 0.2500 mg | SUBCUTANEOUS | 0 refills | Status: DC

## 2023-10-29 MED ORDER — OLMESARTAN MEDOXOMIL 40 MG PO TABS: 40.0000 mg | ORAL_TABLET | Freq: Every day | ORAL | 3 refills | Status: DC

## 2023-10-29 NOTE — Assessment & Plan Note (Signed)
Complex Migraines. Improvement noted with specialist intervention. Sensory symptoms only present during severe headaches. -Continue current treatment plan as directed by specialist.

## 2023-10-29 NOTE — Assessment & Plan Note (Signed)
Blood pressure slightly elevated despite current treatment with Benicar (olmesartan). No adverse effects reported. -Increase Benicar dose from current level. -Check blood pressure at home and report if consistently above 130/80 after 3 days on new dose.

## 2023-10-29 NOTE — Patient Instructions (Signed)
Message if BP >130/80

## 2023-10-29 NOTE — Assessment & Plan Note (Signed)
Difficulty with insurance coverage for [employer]. [employer] insurance ([employer]) to discuss coverage or alternative medication options. -Consider sending [phone] prescription to [facility] for potential coverage.

## 2023-10-29 NOTE — Progress Notes (Signed)
 Marland Kitchen

## 2023-10-29 NOTE — Progress Notes (Signed)
Office Visit  BP 135/88 (BP Location: Left Arm, Patient Position: Sitting, Cuff Size: Large)   Pulse 75   Temp 98.4 F (36.9 C) (Oral)   Resp 15   Wt 254 lb 12.8 oz (115.6 kg)   LMP 10/08/2023 (Exact Date)   SpO2 100%   BMI 38.74 kg/m    Subjective:    Patient ID: Kylie Huerta, female    DOB: [birth year], 33 y.o.   MRN: [id number]  HPI: Kylie Huerta is a 33 y.o. female  Chief Complaint  Patient presents with   Hypertension    Doing good    Discussed the use of AI scribe software for clinical note transcription with the patient, who gave verbal consent to proceed.  History of Present Illness   The patient, with a history of hypertension and complex migraines, reports no new health issues since the last visit. She has been on medication for hypertension, specifically Benicar (olmesartan), and reports no adverse effects such as lightheadedness or abnormal headaches. The patient's usual headaches have been improving overall. Sensory symptoms related to migraines have been noted only during severe headache episodes, with the last episode occurring a week prior to the visit.  The patient also reports an issue with her prescription for [facility], a medication presumably for weight management, which is not being covered by her insurance, [employer]. The patient was asked to pay out of pocket, which she found to be prohibitively expensive.  The patient is also due to return to work and requested a clearance note for the same. She expressed feeling good about returning to work. The patient's blood pressure readings at home have been similar to those in the clinic, slightly higher than the desired range.     Relevant past medical, surgical, family and social history reviewed and updated as indicated. Interim medical history since our last visit reviewed. Allergies and medications reviewed and updated.  ROS per HPI unless specifically indicated above     Objective:    BP 135/88  (BP Location: Left Arm, Patient Position: Sitting, Cuff Size: Large)   Pulse 75   Temp 98.4 F (36.9 C) (Oral)   Resp 15   Wt 254 lb 12.8 oz (115.6 kg)   LMP 10/08/2023 (Exact Date)   SpO2 100%   BMI 38.74 kg/m   Wt Readings from Last 3 Encounters:  10/29/23 254 lb 12.8 oz (115.6 kg)  10/15/23 252 lb 6.4 oz (114.5 kg)  10/08/23 255 lb (115.7 kg)     Physical Exam Constitutional:      Appearance: Normal appearance.  HENT:     Head: Normocephalic and atraumatic.  Eyes:     Pupils: Pupils are equal, round, and reactive to light.  Cardiovascular:     Rate and Rhythm: Normal rate and regular rhythm.     Pulses: Normal pulses.     Heart sounds: Normal heart sounds.  Pulmonary:     Effort: Pulmonary effort is normal.     Breath sounds: Normal breath sounds.  Musculoskeletal:        General: Normal range of motion.     Cervical back: Normal range of motion.  Skin:    General: Skin is warm and dry.     Capillary Refill: Capillary refill takes less than 2 seconds.  Neurological:     General: No focal deficit present.     Mental Status: She is alert. Mental status is at baseline.  Psychiatric:  Mood and Affect: Mood normal.        Behavior: Behavior normal.         10/29/2023   10:20 AM 10/15/2023   11:12 AM 09/03/2023    8:33 AM 08/24/2022   12:27 PM  Depression screen PHQ 2/9  Decreased Interest 0 1 3   Down, Depressed, Hopeless 0 1 2   PHQ - 2 Score 0 2 5   Altered sleeping 0 2 3   Tired, decreased energy 0 1 3   Change in appetite 3 3 3    Feeling bad or failure about yourself  0 1 2   Trouble concentrating 3 1 3    Moving slowly or fidgety/restless 0 0 2   Suicidal thoughts 0 0 1   PHQ-9 Score 6 10 22    Difficult doing work/chores  Very difficult       Information is confidential and restricted. Go to Review Flowsheets to unlock data.       10/29/2023   10:20 AM 10/15/2023   11:13 AM 09/03/2023    8:33 AM 08/24/2022   12:27 PM  GAD 7 : Generalized  Anxiety Score  Nervous, Anxious, on Edge 0 2 3   Control/stop worrying 0 2 3   Worry too much - different things 0 2 3   Trouble relaxing 0 2 3   Restless 0 2 3   Easily annoyed or irritable 3 3 3    Afraid - awful might happen 0 2 3   Total GAD 7 Score 3 15 21    Anxiety Difficulty Somewhat difficult Very difficult       Information is confidential and restricted. Go to Review Flowsheets to unlock data.       Assessment & Plan:  Assessment & Plan   Headache disorder Assessment & Plan: Complex Migraines. Improvement noted with specialist intervention. Sensory symptoms only present during severe headaches. -Continue current treatment plan as directed by specialist.   Essential hypertension Assessment & Plan: Blood pressure slightly elevated despite current treatment with Benicar (olmesartan). No adverse effects reported. -Increase Benicar dose from current level. -Check blood pressure at home and report if consistently above 130/80 after 3 days on new dose.  Orders: -     Wegovy; Inject 0.25 mg into the skin once a week.  Dispense: 2 mL; Refill: 0 -     Olmesartan Medoxomil; Take 1 tablet (40 mg total) by mouth daily.  Dispense: 90 tablet; Refill: 3  Encounter for weight management BMI 38.0-38.9,adult Assessment & Plan: Difficulty with insurance coverage for Wegovy. [employer] insurance ([employer]) to discuss coverage or alternative medication options. -Consider sending [phone] prescription to [facility] for potential coverage.  Orders: -     Wegovy; Inject 0.25 mg into the skin once a week.  Dispense: 2 mL; Refill: 0  Encounter for behavioral health screening As part of their intake evaluation, the patient was screened for depression, anxiety.  PHQ9 SCORE 6, GAD7 SCORE 3. Screening results negative for tested conditions. CTM.  Follow up plan: Return for Depending on wegovy coverage.  Virgene Tirone Howell Pringle, MD

## 2023-10-30 MED ORDER — WEGOVY 0.25 MG/0.5ML ~~LOC~~ SOAJ: 0.2500 mg | SUBCUTANEOUS | 0 refills | Status: DC

## 2023-11-06 ENCOUNTER — Telehealth: Payer: Self-pay | Admitting: Diagnostic Neuroimaging

## 2023-11-06 ENCOUNTER — Institutional Professional Consult (permissible substitution): Payer: Managed Care, Other (non HMO) | Admitting: Neurology

## 2023-11-06 NOTE — Telephone Encounter (Signed)
Pt cancelled appt due to having the flu. Will call back to reschedule.

## 2023-11-14 ENCOUNTER — Encounter: Payer: Self-pay | Admitting: Emergency Medicine

## 2023-11-14 ENCOUNTER — Ambulatory Visit
Admission: EM | Admit: 2023-11-14 | Discharge: 2023-11-14 | Disposition: A | Discharge status: Home / Self Care | Payer: Managed Care, Other (non HMO) | Attending: Emergency Medicine | Admitting: Emergency Medicine

## 2023-11-14 ENCOUNTER — Ambulatory Visit (INDEPENDENT_AMBULATORY_CARE_PROVIDER_SITE_OTHER): Payer: Managed Care, Other (non HMO)

## 2023-11-14 DIAGNOSIS — S92534A Nondisplaced fracture of distal phalanx of right lesser toe(s), initial encounter for closed fracture: Secondary | ICD-10-CM | POA: Diagnosis not present

## 2023-11-14 DIAGNOSIS — M79674 Pain in right toe(s): Secondary | ICD-10-CM

## 2023-11-14 NOTE — Discharge Instructions (Addendum)
 X-ray  shows break in bone   You need to follow-up with podiatry in 1 to 2 weeks for further evaluation and management  We have buddy taped 2 of your toes together to add stability and support when you are completing activity  You may use ice or heat over the affected area in 10 to 15-minute intervals  You may use ibuprofen  taking 600 to 800 mg every 6-8 hours, naproxen  220 to 440 mg twice daily and/or Tylenol  5000 mg every 6 hours as needed

## 2023-11-14 NOTE — ED Triage Notes (Signed)
 Pt was sitting on a chair and she was trying to lift it up and it fell down on her right foot with her in the chair today.

## 2023-11-14 NOTE — ED Provider Notes (Signed)
 Kylie Huerta    CSN: [id number] Arrival date & time: 11/14/23  1817      History   Chief Complaint Chief Complaint  Patient presents with   Foot Pain    HPI Kylie Huerta is a 33 y.o. female.   Patient presents for evaluation of right third toe pain and limited range of motion after accidentally dropping a chair on foot today.  Difficulty bearing weight and unable to wean.  Has not attempted treatment.  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   TIA (transient ischemic attack) 08/22/2023   Eating disorder 08/24/2022   Cannabis use disorder, severe, dependence (HCC) 08/24/2022   Personality disorder (HCC) 08/24/2022   Essential hypertension 11/28/2021   Moderate asthma without complication    Anxiety and depression 11/27/2021   PTSD (post-traumatic stress disorder) 05/08/2021   Bipolar 2 disorder (HCC) 12/23/2018   Headache disorder 07/10/2018   BMI 38.0-38.9,adult 08/01/2017    Past Surgical History:  Procedure Laterality Date   CESAREAN SECTION      OB History     Gravida  1   Para  1   Term  1   Preterm      AB      Living  1      SAB      IAB      Ectopic      Multiple      Live Births  1            Home Medications    Prior to Admission medications   Medication Sig Start Date End Date Taking? Authorizing Provider  albuterol  (VENTOLIN  HFA) 108 (90 Base) MCG/ACT inhaler Inhale 2 puffs into the lungs every 6 (six) hours as needed for wheezing or shortness of breath. 11/28/21   Josette Ade, MD  aspirin  81 MG chewable tablet Chew 1 tablet (81 mg total) by mouth daily. 08/23/23   Patel, Sona, MD  buPROPion (WELLBUTRIN XL) 150 MG 24 hr tablet Take 150 mg by mouth daily.    [provider]  caffeine  200 MG TABS tablet Take 1 tablet  (200 mg total) by mouth every 6 (six) hours as needed for up to 7 days. 08/25/23 05/18/25  Cyrena Mylar, MD  cetirizine (ZYRTEC) 10 MG tablet Take 10 mg by mouth as needed. 11/17/21   [provider]  EPINEPHrine  0.3 mg/0.3 mL IJ SOAJ injection Inject 0.3 mg into the muscle as needed for anaphylaxis. 09/03/23   Herold Hadassah SQUIBB, MD  gabapentin (NEURONTIN) 100 MG capsule Take 100 mg by mouth 3 (three) times daily.    [provider]  lamoTRIgine (LAMICTAL) 25 MG tablet Take 25 mg by mouth daily.    [provider]  olmesartan  (BENICAR ) 40 MG tablet Take 1 tablet (40 mg total) by mouth daily. 10/29/23 10/28/24  Herold Hadassah SQUIBB, MD  Rimegepant Sulfate (NURTEC) 75 MG TBDP Take 1 tablet (75 mg total) by mouth daily as needed. 10/08/23   Penumalli, Eduard SAUNDERS, MD  Semaglutide -Weight Management (WEGOVY ) 0.25 MG/0.5ML SOAJ Inject 0.25 mg into the skin once a week. 10/30/23   Herold Hadassah SQUIBB, MD  topiramate  (TOPAMAX ) 50 MG tablet Take 1 tablet (50 mg total) by mouth 2 (two) times daily. 10/08/23   Penumalli, Vikram R,  MD    Family History Family History  Problem Relation Age of Onset   Drug abuse Mother    Bipolar disorder Mother    Other Mother        brain tumor   Kidney disease Mother    Kidney failure Father    Drug abuse Brother    Drug abuse Brother    Drug abuse Brother    Bipolar disorder Maternal Aunt    Bipolar disorder Maternal Grandmother    Bipolar disorder Paternal Grandmother    Suicidality Cousin     Social History Social History   Tobacco Use   Smoking status: Never    Passive exposure: Never   Smokeless tobacco: Never  Vaping Use   Vaping status: Never Used  Substance Use Topics   Alcohol use: Not Currently   Drug use: Not Currently    Types: Marijuana, Cocaine    Comment: cocaine 3 years ago     Allergies   Almond oil, Other, Tree extract, Bee pollen, Kiwi extract, and Pollen extract   Review of Systems Review of Systems   Physical  Exam Triage Vital Signs ED Triage Vitals  Encounter Vitals Group     BP 11/14/23 1924 (!) 139/99     Systolic BP Percentile --      Diastolic BP Percentile --      Pulse Rate 11/14/23 1924 94     Resp 11/14/23 1924 16     Temp 11/14/23 1924 98.2 F (36.8 C)     Temp Source 11/14/23 1924 Oral     SpO2 11/14/23 1924 97 %     Weight --      Height --      Head Circumference --      Peak Flow --      Pain Score 11/14/23 1923 8     Pain Loc --      Pain Education --      Exclude from Growth Chart --    No data found.  Updated Vital Signs BP (!) 139/99 (BP Location: Left Arm)   Pulse 94   Temp 98.2 F (36.8 C) (Oral)   Resp 16   LMP 11/06/2023 (Exact Date)   SpO2 97%   Visual Acuity Right Eye Distance:   Left Eye Distance:   Bilateral Distance:    Right Eye Near:   Left Eye Near:    Bilateral Near:     Physical Exam Constitutional:      Appearance: Normal appearance.  Eyes:     Extraocular Movements: Extraocular movements intact.  Pulmonary:     Effort: Pulmonary effort is normal.  Musculoskeletal:     Comments: Tenderness, mild to moderate swelling and scant ecchymosis present to the middle and distal phalanx of the right third toe to complete range of motion due to pain elicited, sensation intact, capillary less than 3, 2+ pedal pulse  Neurological:     Mental Status: She is alert and oriented to person, place, and time. Mental status is at baseline.      UC Treatments / Results  Labs (all labs ordered are listed, but only abnormal results are displayed) Labs Reviewed - No data to display  EKG   Radiology DG Foot Complete Right Result Date: 11/14/2023 CLINICAL DATA:  Status post trauma to the third right toe. EXAM: RIGHT FOOT COMPLETE - 3+ VIEW COMPARISON:  None Available. FINDINGS: An ill-defined cortical lucency is seen involving the base of the distal phalanx of the third  right toe. There is no evidence of dislocation. There is no evidence of  arthropathy or other focal bone abnormality. Soft tissues are unremarkable. IMPRESSION: Findings suspicious for a nondisplaced fracture of the distal phalanx of the third right toe. Electronically Signed   By: Suzen Dials M.D.   On: 11/14/2023 19:31    Procedures Procedures (including critical care time)  Medications Ordered in UC Medications - No data to display  Initial Impression / Assessment and Plan / UC Course  I have reviewed the triage vital signs and the nursing notes.  Pertinent labs & imaging results that were available during my care of the patient were reviewed by me and considered in my medical decision making (see chart for details).  Right toe pain, closed nondisplaced fracture of the distal phalanx of lesser toe of right foot, initial encounter  X-ray confirming fracture, discussed with patient, buddy tape the second and third toe for stability and support, recommended doing so at home as well as ibuprofen  or Tylenol  for management with follow-up with podiatry in 1 to 2 weeks, walker Final Clinical Impressions(s) / UC Diagnoses   Final diagnoses:  Toe pain, right  Closed nondisplaced fracture of distal phalanx of lesser toe of right foot, initial encounter     Discharge Instructions      X-ray  shows break in bone   You need to follow-up with podiatry in 1 to 2 weeks for further evaluation and management  We have buddy taped 2 of your toes together to add stability and support when you are completing activity  You may use ice or heat over the affected area in 10 to 15-minute intervals  You may use ibuprofen  taking 600 to 800 mg every 6-8 hours, naproxen  220 to 440 mg twice daily and/or Tylenol  5000 mg every 6 hours as needed       ED Prescriptions   None    PDMP not reviewed this encounter.   Teresa Shelba SAUNDERS, [place] 11/14/23 [phone]

## 2023-12-11 NOTE — Telephone Encounter (Signed)
 Called patient left voice mail for patient to call office and schedule appt for weight management

## 2023-12-25 ENCOUNTER — Encounter: Payer: Self-pay | Admitting: Pediatrics

## 2023-12-25 ENCOUNTER — Telehealth (INDEPENDENT_AMBULATORY_CARE_PROVIDER_SITE_OTHER): Admitting: Pediatrics

## 2023-12-25 VITALS — Ht 67.99 in | Wt 254.0 lb

## 2023-12-25 DIAGNOSIS — K219 Gastro-esophageal reflux disease without esophagitis: Secondary | ICD-10-CM

## 2023-12-25 DIAGNOSIS — Z6838 Body mass index (BMI) 38.0-38.9, adult: Secondary | ICD-10-CM

## 2023-12-25 DIAGNOSIS — E785 Hyperlipidemia, unspecified: Secondary | ICD-10-CM

## 2023-12-25 DIAGNOSIS — I10 Essential (primary) hypertension: Secondary | ICD-10-CM | POA: Diagnosis not present

## 2023-12-25 NOTE — Progress Notes (Signed)
 Telehealth Visit  I connected with  [name] on 12/31/23 by a video enabled telemedicine application and verified that I am speaking with the correct person using two identifiers.   I discussed the limitations of evaluation and management by telemedicine. The patient expressed understanding and agreed to proceed.  Subjective:    Patient ID: Kylie Huerta, female    DOB: 10-26-1990, 33 y.o.   MRN: [id number]  HPI: Kylie Huerta is a 33 y.o. female  Chief Complaint  Patient presents with   Weight Loss    Discussed the use of AI scribe software for clinical note transcription with the patient, who gave verbal consent to proceed.  History of Present Illness   Kylie Huerta is a 33 year old female who presents with challenges in accessing weight management treatments.  She has been attempting to manage her weight through bariatric surgery and weight loss medications for the past year. She has faced multiple stipulations from her insurance for bariatric surgery approval, including requirements for therapy and psychiatric evaluation.  Insurance does not cover weight loss medications, which has been a significant barrier. She has attempted to contact her insurance provider for clarification and approval but has been unable to reach anyone.  She has explored injectable medications like Wegovy and Zepbound, but these are not covered as they are considered non-essential for health by her insurance. Even with discounts, the cost remains prohibitive, with prices over a thousand dollars, although some programs offer reduced rates of $500 or $300 per month.  She has looked into compounded pharmacies for injectable medications but found that some options, like [facility], are not available in [place]. She has not yet tried [facility].      Relevant past medical, surgical, family and social history reviewed and updated as indicated. Interim medical history since our last visit  reviewed. Allergies and medications reviewed and updated.  ROS per HPI unless specifically indicated above     Objective:    Ht 5' 7.99" (1.727 m)   Wt 254 lb (115.2 kg)   LMP 12/06/2023 (Exact Date)   BMI 38.63 kg/m   Wt Readings from Last 3 Encounters:  12/25/23 254 lb (115.2 kg)  10/29/23 254 lb 12.8 oz (115.6 kg)  10/15/23 252 lb 6.4 oz (114.5 kg)     Physical Exam Constitutional:      General: She is not in acute distress.    Appearance: Normal appearance.  Neurological:     General: No focal deficit present.     Mental Status: She is alert. Mental status is at baseline.     LIMITED EXAM GIVEN VIDEO VISIT     Assessment & Plan:  Assessment & Plan   BMI 38.0-38.9,adult Essential hypertension Gastroesophageal reflux disease, unspecified whether esophagitis present Dyslipidemia Obesity w comorbidies: HTN, GERD, HLD. She is pursuing bariatric surgery and weight loss medications but faces insurance coverage challenges. Injectable medications are not covered by her insurance. She prefers bariatric surgery at [facility] and is open to affordable injectable options. - Clarify insurance coverage for bariatric surgery and injectable medications. - Contact surgical team to determine requirements for bariatric surgery approval. - Refer to [facility] for bariatric surgery consultation. - Investigate out-of-pocket cost programs for [facility] and Zepbound. - Explore compounded pharmacy options for injectable medications. - Send a message with the plan after clarifying insurance coverage and surgical requirements.    -     Amb Referral to Bariatric Surgery  Follow up plan: No follow-ups on file.  Jackolyn Confer, MD   This visit was completed via video visit through MyChart due to the restrictions of the COVID-19 pandemic. All issues as above were discussed and addressed. Physical exam was done as above through visual confirmation on video through MyChart. If it was  felt that the patient should be evaluated in the office, they were directed there. The patient verbally consented to this visit. Location of the patient: home Location of the provider: work Those involved with this call:  Provider: Modena Nunnery, MD CMA: Babs Bertin, CMA Time spent on call:  15 minutes with patient face to face via video conference. More than 50% of this time was spent in counseling and coordination of care. 15 minutes total spent in review of patient's record and preparation of their chart. Total time spent on this encounter: 30 minutes.

## 2023-12-31 ENCOUNTER — Encounter: Payer: Self-pay | Admitting: Pediatrics

## 2023-12-31 NOTE — Patient Instructions (Addendum)
 Placed referral to [facility]  Will look into insurance clarification on wegovy - they have a self pay program $500/mo  Zepbound also has a program that is $325/mo then $500/mo with higher doses

## 2024-04-06 ENCOUNTER — Telehealth: Payer: Managed Care, Other (non HMO) | Admitting: Diagnostic Neuroimaging

## 2024-05-27 ENCOUNTER — Telehealth: Admitting: Pediatrics

## 2024-05-27 DIAGNOSIS — F3181 Bipolar II disorder: Secondary | ICD-10-CM | POA: Diagnosis not present

## 2024-05-27 DIAGNOSIS — R519 Headache, unspecified: Secondary | ICD-10-CM

## 2024-05-27 DIAGNOSIS — F419 Anxiety disorder, unspecified: Secondary | ICD-10-CM

## 2024-05-27 NOTE — Progress Notes (Unsigned)
 Telehealth Visit  I connected with  Kylie Huerta on 06/04/24 by a video enabled telemedicine application and verified that I am speaking with the correct person using two identifiers.   I discussed the limitations of evaluation and management by telemedicine. The patient expressed understanding and agreed to proceed.  Subjective:    Patient ID: Kylie Huerta, female    DOB: 08/29/1991, 33 y.o.   MRN: [id number]  HPI: Kylie Huerta is a 33 y.o. female  Chief Complaint  Patient presents with   Follow-up    Discussed the use of AI scribe software for clinical note transcription with the patient, who gave verbal consent to proceed.  History of Present Illness   Kylie Huerta is a 33 year old female who presents for assistance with FMLA paperwork due to mental health appointments and chronic headaches.  She requires frequent mental health appointments with her psychiatrist, occurring approximately once a month over the next couple of months.  She experiences chronic headaches that are particularly severe around her menstrual cycle, during ovulation and menstruation. She estimates having about five to seven episodes per month, which are unpredictable in timing. These headaches have led to unexcused absences from work, and she is concerned about job Office manager.  She works in a job that involves screen time and sitting, with no physical labor involved.      Relevant past medical, surgical, family and social history reviewed and updated as indicated. Interim medical history since our last visit reviewed. Allergies and medications reviewed and updated.  ROS per HPI unless specifically indicated above     Objective:    LMP 05/18/2024 (Approximate)   Wt Readings from Last 3 Encounters:  12/25/23 254 lb (115.2 kg)  10/29/23 254 lb 12.8 oz (115.6 kg)  10/15/23 252 lb 6.4 oz (114.5 kg)     Physical Exam Constitutional:      General: She is not in acute distress.     Appearance: Normal appearance.  Neurological:     General: No focal deficit present.     Mental Status: She is alert. Mental status is at baseline.      LIMITED EXAM GIVEN VIDEO VISIT     Assessment & Plan:  Assessment & Plan   Bipolar 2 disorder (HCC) Anxiety and depression Assessment & Plan: Monthly psychiatric appointments necessitate work absences. Management per psych. - Complete FMLA paperwork for psychiatric appointment absences. - Advised her to request and submit FMLA paperwork from HR.   Headache disorder Assessment & Plan: Severe headaches linked to menstrual cycle, causing frequent work absences. - Complete FMLA paperwork for migraine-related absences. - Advised her to request and submit FMLA paperwork from HR.   Follow up plan: Return if symptoms worsen or fail to improve.  Hadassah SHAUNNA Nett, MD   This visit was completed via video visit through MyChart due to the restrictions of the COVID-19 pandemic. All issues as above were discussed and addressed. Physical exam was done as above through visual confirmation on video through MyChart. If it was felt that the patient should be evaluated in the office, they were directed there. The patient verbally consented to this visit.  Location of the patient: home Location of the provider: work Those involved with this call:  Provider: Hadassah Nett, MD CMA: Cena Maffucci, CMA Time spent on call: 15 minutes with patient face to face via video conference. More than 50% of this time was spent in counseling and coordination of care. 15 minutes total  spent in review of patient's record and preparation of their chart. Total time spent on this encounter: 30 minutes.

## 2024-06-01 ENCOUNTER — Telehealth: Payer: Self-pay | Admitting: Pediatrics

## 2024-06-01 NOTE — Telephone Encounter (Signed)
 Called patient and explained to her that the paperwork has not been completed. She stated that she was told that it would be completed on Friday.

## 2024-06-01 NOTE — Telephone Encounter (Signed)
 Copied from CRM #[id number]. Topic: General - Other >> Jun 01, 2024 10:01 AM Kylie Huerta wrote: Reason for CRM: Patient following up to see if her FMLA paperwork has been completed. Is requesting a call to discuss.  Patient can be reached at [phone]

## 2024-06-02 NOTE — Telephone Encounter (Signed)
 Paperwork faxed 8/25

## 2024-06-04 ENCOUNTER — Encounter: Payer: Self-pay | Admitting: Pediatrics

## 2024-06-04 NOTE — Assessment & Plan Note (Signed)
 Severe headaches linked to menstrual cycle, causing frequent work absences. - Complete FMLA paperwork for migraine-related absences. - Advised her to request and submit FMLA paperwork from HR.

## 2024-06-04 NOTE — Assessment & Plan Note (Signed)
 Monthly psychiatric appointments necessitate work absences. Management per psych. - Complete FMLA paperwork for psychiatric appointment absences. - Advised her to request and submit FMLA paperwork from HR.

## 2024-06-05 NOTE — Telephone Encounter (Signed)
 Attempted to reach pt to inform her that paperwork has been sent to be scanned in her chart unsuccessful okay for E2C2 to inform pt if the call is returned

## 2024-06-05 NOTE — Telephone Encounter (Signed)
 Patient is calling to request if FMLA paper work can be uploaded to her mychart for her records.

## 2024-06-09 NOTE — Telephone Encounter (Signed)
 Called and spoke with patient wants to come by and pick up a copy printed and placed in the completed bin

## 2024-06-16 ENCOUNTER — Telehealth: Admitting: Physician Assistant

## 2024-06-16 DIAGNOSIS — I10 Essential (primary) hypertension: Secondary | ICD-10-CM

## 2024-06-16 DIAGNOSIS — N943 Premenstrual tension syndrome: Secondary | ICD-10-CM | POA: Diagnosis not present

## 2024-06-16 NOTE — Patient Instructions (Signed)
 Kylie Huerta, thank you for joining [name], PA-C for today's virtual visit.  While this provider is not your primary care provider (PCP), if your PCP is located in our provider database this encounter information will be shared with them immediately following your visit.   A [facility] MyChart account gives you access to today's visit and all your visits, tests, and labs performed at [facility]  click here if you don't have a [place] [facility] MyChart account or go to [email or web address]  Consent: (Patient) Kylie Huerta provided verbal consent for this virtual visit at the beginning of the encounter.  Current Medications:  Current Outpatient Medications:    albuterol  (VENTOLIN  HFA) 108 (90 Base) MCG/ACT inhaler, Inhale 2 puffs into the lungs every 6 (six) hours as needed for wheezing or shortness of breath., Disp: 8 g, Rfl: 0   aspirin  81 MG chewable tablet, Chew 1 tablet (81 mg total) by mouth daily., Disp: 30 tablet, Rfl: 0   atomoxetine (STRATTERA) 18 MG capsule, Take 18 mg by mouth daily., Disp: , Rfl:    buPROPion (WELLBUTRIN XL) 150 MG 24 hr tablet, Take 150 mg by mouth daily., Disp: , Rfl:    caffeine  200 MG TABS tablet, Take 1 tablet (200 mg total) by mouth every 6 (six) hours as needed for up to 7 days., Disp: 20 tablet, Rfl: 0   cetirizine (ZYRTEC) 10 MG tablet, Take 10 mg by mouth as needed., Disp: , Rfl:    EPINEPHrine  0.3 mg/0.3 mL IJ SOAJ injection, Inject 0.3 mg into the muscle as needed for anaphylaxis., Disp: 1 each, Rfl: 3   gabapentin (NEURONTIN) 100 MG capsule, Take 100 mg by mouth 3 (three) times daily., Disp: , Rfl:    lamoTRIgine (LAMICTAL) 25 MG tablet, Take 25 mg by mouth daily., Disp: , Rfl:    olmesartan  (BENICAR ) 40 MG tablet, Take 1 tablet (40 mg total) by mouth daily., Disp: 90 tablet, Rfl: 3   Rimegepant Sulfate (NURTEC) 75 MG TBDP, Take 1 tablet (75 mg total) by mouth daily as needed., Disp: 8 tablet, Rfl: 6   Semaglutide -Weight  Management (WEGOVY ) 0.25 MG/0.5ML SOAJ, Inject 0.25 mg into the skin once a week., Disp: 2 mL, Rfl: 0   topiramate  (TOPAMAX ) 50 MG tablet, Take 1 tablet (50 mg total) by mouth 2 (two) times daily., Disp: 60 tablet, Rfl: 12   traZODone  (DESYREL ) 50 MG tablet, Take 50 mg by mouth at bedtime., Disp: , Rfl:    Medications ordered in this encounter:  No orders of the defined types were placed in this encounter.    *If you need refills on other medications prior to your next appointment, please contact your pharmacy*  Follow-Up: Call back or seek an in-person evaluation if the symptoms worsen or if the condition fails to improve as anticipated.  [facility] [phone]  Other Instructions Continue to follow up with PCP as scheduled. May consider SSRI to help with PMS symptoms. Follow up with Neurologist as needed for migraine headaches. Take Aleve  as needed for headache. Continue to monitor BP Continue with Amlodipine  as previously prescribed.   If you have been instructed to have an in-person evaluation today at a local Urgent Care facility, please use the link below. It will take you to a list of all of our available [facility], including address, phone number and hours of operation. Please do not delay care.  [facility]  If you or a family member  do not have a primary care provider, use the link below to schedule a visit and establish care. When you choose a [facility] primary care physician or advanced practice provider, you gain a long-term partner in health. Find a Primary Care Provider  Learn more about [place]'s in-office and virtual care options: [facility] - Get Care Now

## 2024-06-16 NOTE — Progress Notes (Signed)
 Virtual Visit Consent   Kylie Huerta, you are scheduled for a virtual visit with a [facility] provider today. Just as with appointments in the office, your consent must be obtained to participate. Your consent will be active for this visit and any virtual visit you may have with one of our providers in the next 365 days. If you have a MyChart account, a copy of this consent can be sent to you electronically.  As this is a virtual visit, video technology does not allow for your provider to perform a traditional examination. This may limit your provider's ability to fully assess your condition. If your provider identifies any concerns that need to be evaluated in person or the need to arrange testing (such as labs, EKG, etc.), we will make arrangements to do so. Although advances in technology are sophisticated, we cannot ensure that it will always work on either your end or our end. If the connection with a video visit is poor, the visit may have to be switched to a telephone visit. With either a video or telephone visit, we are not always able to ensure that we have a secure connection.  By engaging in this virtual visit, you consent to the provision of healthcare and authorize for your insurance to be billed (if applicable) for the services provided during this visit. Depending on your insurance coverage, you may receive a charge related to this service.  I need to obtain your verbal consent now. Are you willing to proceed with your visit today? Kylie Huerta has provided verbal consent on 06/16/2024 for a virtual visit (video or telephone). Kylie Huerta, [place]  Date: 06/16/2024 7:54 PM   Virtual Visit via Video Note   I, Kylie Huerta, connected with  Kylie Huerta  ([id number], 02/12/1991) on 06/16/24 at  7:45 PM EDT by a video-enabled telemedicine application and verified that I am speaking with the correct person using two identifiers.  Location: Patient: Virtual Visit Location Patient:  Home Provider: Virtual Visit Location Provider: Home Office   I discussed the limitations of evaluation and management by telemedicine and the availability of in person appointments. The patient expressed understanding and agreed to proceed.    History of Present Illness: Kylie Huerta is a 33 y.o. who identifies as a female who was assigned female at birth, and is being seen today for headache.  HPI: 33y/o F presents for a telehealth video visit for c/o headache especially during premenstrual syndrome. Pt noticed her blood pressure is also elevated during this time. Currently on Amlodipine  to help control it. She is also on a low salt diet. Follows up with Neurologist who has diagnosed her as complex migraines. She takes Aleve  when her headache is unbearable which does help.   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   TIA (transient ischemic attack) 08/22/2023   Dyslipidemia 09/04/2022   GERD (gastroesophageal reflux disease) 09/04/2022   Moderate persistent asthma without complication 09/04/2022   Eating disorder 08/24/2022   Cannabis use disorder, severe, dependence (HCC) 08/24/2022   Personality disorder (HCC) 08/24/2022   Essential hypertension 11/28/2021   Moderate asthma without complication    Anxiety and depression 11/27/2021   PTSD (post-traumatic stress disorder) 05/08/2021   Bipolar 2 disorder (HCC) 12/23/2018   Headache disorder 07/10/2018   BMI 38.0-38.9,adult 08/01/2017    Allergies:  Allergies  Allergen Reactions   Almond Oil  Shortness Of Breath and Swelling   Other Shortness Of Breath and Swelling    Walnuts, Tree nuts    Tree Extract Anaphylaxis and Swelling   Bee Pollen Hives   Kiwi Extract Hives   Pollen Extract    Medications:  Current Outpatient Medications:    albuterol  (VENTOLIN  HFA) 108 (90 Base) MCG/ACT inhaler, Inhale 2 puffs into the lungs  every 6 (six) hours as needed for wheezing or shortness of breath., Disp: 8 g, Rfl: 0   aspirin  81 MG chewable tablet, Chew 1 tablet (81 mg total) by mouth daily., Disp: 30 tablet, Rfl: 0   atomoxetine (STRATTERA) 18 MG capsule, Take 18 mg by mouth daily., Disp: , Rfl:    buPROPion (WELLBUTRIN XL) 150 MG 24 hr tablet, Take 150 mg by mouth daily., Disp: , Rfl:    caffeine  200 MG TABS tablet, Take 1 tablet (200 mg total) by mouth every 6 (six) hours as needed for up to 7 days., Disp: 20 tablet, Rfl: 0   cetirizine (ZYRTEC) 10 MG tablet, Take 10 mg by mouth as needed., Disp: , Rfl:    EPINEPHrine  0.3 mg/0.3 mL IJ SOAJ injection, Inject 0.3 mg into the muscle as needed for anaphylaxis., Disp: 1 each, Rfl: 3   gabapentin (NEURONTIN) 100 MG capsule, Take 100 mg by mouth 3 (three) times daily., Disp: , Rfl:    lamoTRIgine (LAMICTAL) 25 MG tablet, Take 25 mg by mouth daily., Disp: , Rfl:    olmesartan  (BENICAR ) 40 MG tablet, Take 1 tablet (40 mg total) by mouth daily., Disp: 90 tablet, Rfl: 3   Rimegepant Sulfate (NURTEC) 75 MG TBDP, Take 1 tablet (75 mg total) by mouth daily as needed., Disp: 8 tablet, Rfl: 6   Semaglutide -Weight Management (WEGOVY ) 0.25 MG/0.5ML SOAJ, Inject 0.25 mg into the skin once a week., Disp: 2 mL, Rfl: 0   topiramate  (TOPAMAX ) 50 MG tablet, Take 1 tablet (50 mg total) by mouth 2 (two) times daily., Disp: 60 tablet, Rfl: 12   traZODone  (DESYREL ) 50 MG tablet, Take 50 mg by mouth at bedtime., Disp: , Rfl:   Observations/Objective: Patient is well-developed, well-nourished in no acute distress.  Resting comfortably  at home.  Head is normocephalic, atraumatic.  No labored breathing.  Speech is clear and coherent with logical content.  Patient is alert and oriented at baseline.    Assessment and Plan: 1. Essential hypertension (Primary)  2. PMS (premenstrual syndrome)  Continue to follow up with PCP as scheduled. May consider SSRI to help with PMS symptoms. Follow up  with Neurologist as needed for migraine headaches. Take Aleve  as needed for headache. Continue to monitor BP Continue with Amlodipine  as previously prescribed. Pt verbalized understanding and in agreement.    Follow Up Instructions: I discussed the assessment and treatment plan with the patient. The patient was provided an opportunity to ask questions and all were answered. The patient agreed with the plan and demonstrated an understanding of the instructions.  A copy of instructions were sent to the patient via MyChart unless otherwise noted below.   Patient has requested to receive PHI (AVS, Work Notes, etc) pertaining to this video visit through e-mail as they are currently without active MyChart. They have voiced understand that email is not considered secure and their health information could be viewed by someone other than the patient.   The patient was advised to call back or seek an in-person evaluation if the symptoms worsen or if the condition fails to improve as  anticipated.    Jessyca Sloan, PA-C

## 2024-08-10 ENCOUNTER — Telehealth: Payer: Self-pay | Admitting: Pediatrics

## 2024-08-10 NOTE — Telephone Encounter (Signed)
 FMLA paperwork has been placed in providers folder.

## 2024-08-12 ENCOUNTER — Telehealth: Admitting: Pediatrics

## 2024-08-12 VITALS — Ht 67.99 in | Wt 240.0 lb

## 2024-08-12 DIAGNOSIS — F3181 Bipolar II disorder: Secondary | ICD-10-CM | POA: Diagnosis not present

## 2024-08-12 DIAGNOSIS — Z6836 Body mass index (BMI) 36.0-36.9, adult: Secondary | ICD-10-CM

## 2024-08-12 NOTE — Progress Notes (Signed)
 Telehealth Visit  I connected with  Kylie Huerta on 08/18/24 by a video enabled telemedicine application and verified that I am speaking with the correct person using two identifiers.   I discussed the limitations of evaluation and management by telemedicine. The patient expressed understanding and agreed to proceed.  Subjective:    Patient ID: Kylie Huerta, female    DOB: May 28, 1991, 33 y.o.   MRN: [id number]  HPI: Kylie Huerta is a 33 y.o. female  Chief Complaint  Patient presents with   FMLA    Pending for Bariatric surgery.     Discussed the use of AI scribe software for clinical note transcription with the patient, who gave verbal consent to proceed.  History of Present Illness   Kylie Huerta is a 33 year old female who presents for revision of FMLA paperwork and discussion of bariatric surgery.  She is preparing for bariatric surgery and is enthusiastic about the procedure. She acknowledges the extensive preparation required for bariatric surgery.  She has been diagnosed with several mental health conditions and is currently on medication. Her mental health is described as 'pretty good', but she mentions that her medications are still being adjusted, describing the process as 'trial and error'. She is currently on the same medications as before, although the specific medications are not mentioned.  She confirms the need for revision of her FMLA paperwork to include an additional four days per month.     Relevant past medical, surgical, family and social history reviewed and updated as indicated. Interim medical history since our last visit reviewed. Allergies and medications reviewed and updated.  ROS per HPI unless specifically indicated above     Objective:    Ht 5' 7.99 (1.727 m)   Wt 240 lb (108.9 kg)   BMI 36.50 kg/m   Wt Readings from Last 3 Encounters:  08/12/24 240 lb (108.9 kg)  12/25/23 254 lb (115.2 kg)  10/29/23 254 lb 12.8 oz (115.6 kg)      Physical Exam Constitutional:      General: She is not in acute distress.    Appearance: Normal appearance.  Neurological:     General: No focal deficit present.     Mental Status: She is alert. Mental status is at baseline.      LIMITED EXAM GIVEN VIDEO VISIT     Assessment & Plan:  Assessment & Plan   Bipolar 2 disorder (HCC) Assessment & Plan: Under active management with ongoing medication adjustments. Genetic swab test suggested to guide medication adjustments. - Schedule nurse visit for genetic swab test.   BMI 36.0-36.9,adult Assessment & Plan: Proceeding with bariatric surgery. CTM.     FMLA adjusted given needs for more missed days for follow up appointments.   Follow up plan: No follow-ups on file.  Kylie SHAUNNA Nett, MD   This visit was completed via video visit through MyChart due to the restrictions of the COVID-19 pandemic. All issues as above were discussed and addressed. Physical exam was done as above through visual confirmation on video through MyChart. If it was felt that the patient should be evaluated in the office, they were directed there. The patient verbally consented to this visit.  Location of the patient: home Location of the provider: work Those involved with this call:  Provider: Hadassah Nett, MD CMA: Comer Sarah, CMA Time spent on call: 10 minutes with patient face to face via video conference. More than 50% of this time was spent in  counseling and coordination of care. 10 minutes total spent in review of patient's record and preparation of their chart. Total time spent on this encounter: 20 minutes.

## 2024-08-13 ENCOUNTER — Encounter: Payer: Self-pay | Admitting: Pediatrics

## 2024-08-18 ENCOUNTER — Encounter: Payer: Self-pay | Admitting: Pediatrics

## 2024-08-18 NOTE — Assessment & Plan Note (Signed)
 Proceeding with bariatric surgery. CTM.

## 2024-08-18 NOTE — Assessment & Plan Note (Signed)
 Under active management with ongoing medication adjustments. Genetic swab test suggested to guide medication adjustments. - Schedule nurse visit for genetic swab test.

## 2024-08-20 NOTE — Telephone Encounter (Signed)
 Scheduled

## 2024-08-21 ENCOUNTER — Ambulatory Visit

## 2024-08-21 ENCOUNTER — Ambulatory Visit: Admitting: Pediatrics

## 2024-08-26 ENCOUNTER — Encounter: Payer: Self-pay | Admitting: Pediatrics

## 2024-08-26 ENCOUNTER — Ambulatory Visit (INDEPENDENT_AMBULATORY_CARE_PROVIDER_SITE_OTHER): Admitting: Pediatrics

## 2024-08-26 VITALS — BP 151/98 | HR 65 | Temp 98.1°F | Ht 66.0 in | Wt 246.6 lb

## 2024-08-26 DIAGNOSIS — Z6839 Body mass index (BMI) 39.0-39.9, adult: Secondary | ICD-10-CM | POA: Diagnosis not present

## 2024-08-26 DIAGNOSIS — I10 Essential (primary) hypertension: Secondary | ICD-10-CM

## 2024-08-26 MED ORDER — WEGOVY 0.25 MG/0.5ML ~~LOC~~ SOAJ: 0.2500 mg | SUBCUTANEOUS | 0 refills | Status: DC

## 2024-08-26 MED ORDER — AMLODIPINE BESYLATE 10 MG PO TABS: 10.0000 mg | ORAL_TABLET | Freq: Every day | ORAL | 2 refills | Status: DC

## 2024-08-26 MED ORDER — WEGOVY 0.25 MG/0.5ML ~~LOC~~ SOAJ: 0.2500 mg | SUBCUTANEOUS | 0 refills | Status: AC

## 2024-08-26 NOTE — Patient Instructions (Signed)
 Amlodipine 10 mg

## 2024-08-26 NOTE — Assessment & Plan Note (Signed)
 Hypertension with consistently high readings. Suspected contribution from lamotrigine, Wellbutrin, and Strattera. No family history. Genetic component considered. - Sent prescription for amlodipine  10 mg daily. - Scheduled virtual follow-up in 2-3 weeks to assess blood pressure response. - Instructed to check blood pressure at home and report if no improvement after one week. - Advised to discuss with psychiatrist about discontinuing Strattera. - Will consider combining medications into a single pill once optimal dosing is achieved.

## 2024-08-26 NOTE — Progress Notes (Signed)
 Office Visit  BP (!) 151/98 (BP Location: Left Arm, Cuff Size: Large)   Pulse 65   Temp 98.1 F (36.7 C) (Oral)   Ht 5' 6 (1.676 m)   Wt 246 lb 9.6 oz (111.9 kg)   LMP  (LMP Unknown)   SpO2 98%   BMI 39.80 kg/m    Subjective:    Patient ID: Kylie Huerta, female    DOB: [birth year], 33 y.o.   MRN: [id number]  HPI: Kylie Huerta is a 33 y.o. female  Chief Complaint  Patient presents with   Hypertension    Discussed the use of AI scribe software for clinical note transcription with the patient, who gave verbal consent to proceed.  History of Present Illness   Kylie Huerta is a 33 year old female with hypertension who presents with elevated blood pressure readings and associated symptoms.  She has consistently high blood pressure readings at home, with both systolic and diastolic values in the three-digit range for the past week. She experiences headaches and exhaustion related to her elevated blood pressure. She reports that her blood pressure has been noted by doctors during visits, particularly as she prepares for an upcoming surgery.  She is currently taking olmesartan , hydrochlorothiazide , and amlodipine  for her hypertension, with the amlodipine  dose at 5 mg. She has concerns about her psychiatric medications, particularly lamotrigine, which she believes may be contributing to her high blood pressure and severe headaches. She recently stopped taking lamotrigine after experiencing intense headaches and a fainting episode. She is also on Wellbutrin and Strattera, which she acknowledges could affect her blood pressure.  She is preparing for a surgical procedure that involves a camera being placed down her throat, scheduled for the following day. She is in communication with her psychiatrist regarding her medication management.  She mentions that she has no known family history of high blood pressure, although she is unsure if her family has been regularly checking their  blood pressure. Her elevated blood pressure has been a consistent issue over the past few years.      Relevant past medical, surgical, family and social history reviewed and updated as indicated. Interim medical history since our last visit reviewed. Allergies and medications reviewed and updated.  ROS per HPI unless specifically indicated above     Objective:    BP (!) 151/98 (BP Location: Left Arm, Cuff Size: Large)   Pulse 65   Temp 98.1 F (36.7 C) (Oral)   Ht 5' 6 (1.676 m)   Wt 246 lb 9.6 oz (111.9 kg)   LMP  (LMP Unknown)   SpO2 98%   BMI 39.80 kg/m   Wt Readings from Last 3 Encounters:  08/26/24 246 lb 9.6 oz (111.9 kg)  08/12/24 240 lb (108.9 kg)  12/25/23 254 lb (115.2 kg)     Physical Exam Constitutional:      Appearance: Normal appearance.  Pulmonary:     Effort: Pulmonary effort is normal.  Musculoskeletal:        General: Normal range of motion.  Skin:    Comments: Normal skin color  Neurological:     General: No focal deficit present.     Mental Status: She is alert. Mental status is at baseline.  Psychiatric:        Mood and Affect: Mood normal.        Behavior: Behavior normal.        Thought Content: Thought content normal.  08/26/2024    8:46 AM 08/12/2024    1:40 PM 12/25/2023    1:19 PM 10/29/2023   10:20 AM 10/15/2023   11:12 AM  Depression screen PHQ 2/9  Decreased Interest 1 0 0 0 1  Down, Depressed, Hopeless 1 0 0 0 1  PHQ - 2 Score 2 0 0 0 2  Altered sleeping 3   0 2  Tired, decreased energy 3   0 1  Change in appetite 0   3 3  Feeling bad or failure about yourself  1   0 1  Trouble concentrating 3   3 1   Moving slowly or fidgety/restless 0   0 0  Suicidal thoughts 0   0 0  PHQ-9 Score 12   6  10    Difficult doing work/chores Very difficult    Very difficult     Data saved with a previous flowsheet row definition       08/26/2024    8:47 AM 12/25/2023    1:19 PM 10/29/2023   10:20 AM 10/15/2023   11:13 AM  GAD 7 :  Generalized Anxiety Score  Nervous, Anxious, on Edge 1 2 0 2  Control/stop worrying 1 1 0 2  Worry too much - different things 1 1 0 2  Trouble relaxing 1 1 0 2  Restless 1 1 0 2  Easily annoyed or irritable 1 2 3 3   Afraid - awful might happen 1 0 0 2  Total GAD 7 Score 7 8 3 15   Anxiety Difficulty Very difficult Very difficult Somewhat difficult Very difficult       Assessment & Plan:  Assessment & Plan   Essential hypertension Assessment & Plan: Hypertension with consistently high readings. Suspected contribution from lamotrigine, Wellbutrin, and Strattera. No family history. Genetic component considered. - Sent prescription for amlodipine  10 mg daily. - Scheduled virtual follow-up in 2-3 weeks to assess blood pressure response. - Instructed to check blood pressure at home and report if no improvement after one week. - Advised to discuss with psychiatrist about discontinuing Strattera. - Will consider combining medications into a single pill once optimal dosing is achieved.  Orders: -     amLODIPine  Besylate; Take 1 tablet (10 mg total) by mouth daily.  Dispense: 30 tablet; Refill: 2  BMI 39.0-39.9,adult Assessment & Plan: Management includes upcoming bariatric surgery. Discussed Wegovy  injections as a cost-effective alternative. - Prescribed Wegovy  injections at new reduced price through [facility] - Instructed to discuss with surgeon about starting Wegovy  before surgery  Orders: -     Wegovy ; Inject 0.25 mg into the skin once a week.  Dispense: 2 mL; Refill: 0    Follow up plan: Return in about 3 weeks (around 09/16/2024) for HTN, (virtual).  Hadassah SHAUNNA Nett, MD

## 2024-08-26 NOTE — Assessment & Plan Note (Signed)
 Management includes upcoming bariatric surgery. Discussed Wegovy  injections as a cost-effective alternative. - Prescribed Wegovy  injections at new reduced price through [facility] - Instructed to discuss with surgeon about starting Wegovy  before surgery

## 2024-08-27 ENCOUNTER — Ambulatory Visit: Admitting: Pediatrics

## 2024-09-17 ENCOUNTER — Telehealth (INDEPENDENT_AMBULATORY_CARE_PROVIDER_SITE_OTHER): Admitting: Pediatrics

## 2024-09-17 ENCOUNTER — Encounter: Payer: Self-pay | Admitting: Pediatrics

## 2024-09-17 VITALS — BP 125/90 | Ht 65.98 in | Wt 246.0 lb

## 2024-09-17 DIAGNOSIS — F908 Attention-deficit hyperactivity disorder, other type: Secondary | ICD-10-CM

## 2024-09-17 DIAGNOSIS — F3181 Bipolar II disorder: Secondary | ICD-10-CM

## 2024-09-17 DIAGNOSIS — I10 Essential (primary) hypertension: Secondary | ICD-10-CM | POA: Diagnosis not present

## 2024-09-17 DIAGNOSIS — Z6839 Body mass index (BMI) 39.0-39.9, adult: Secondary | ICD-10-CM

## 2024-09-17 MED ORDER — OLMESARTAN-AMLODIPINE-HCTZ 40-10-25 MG PO TABS: 1.0000 | ORAL_TABLET | Freq: Every day | ORAL | 1 refills | Status: AC

## 2024-09-17 NOTE — Assessment & Plan Note (Signed)
 Blood pressure generally well-controlled with occasional elevations during menstrual periods. Current medications include amlodipine , olmesartan , and hydrochlorothiazide . Diastolic pressure slightly elevated today. - Submitted prescription for combined pill of amlodipine , olmesartan , and hydrochlorothiazide  with increased hydrochlorothiazide  to 25 mg. - Monitor blood pressure regularly. - Adjust medication dosage if blood pressure increases significantly

## 2024-09-17 NOTE — Assessment & Plan Note (Addendum)
 With comborbid ADHD. Currently managed with Adderall and atomoxetine. Recent medication adjustment due to provider change. Medications confirmed by new psychiatrist. She reports having severe episode while off medication during psychiatry change of providers. Will update FMLA paperwork to reflect this from November. - Continue current ADHD medication regimen with Adderall and atomoxetine. - Follow up with psychiatrist in January for medication update.

## 2024-09-17 NOTE — Progress Notes (Signed)
 Telehealth Visit  I connected with  Kylie Huerta on 09/17/2024 by a video enabled telemedicine application and verified that I am speaking with the correct person using two identifiers.   I discussed the limitations of evaluation and management by telemedicine. The patient expressed understanding and agreed to proceed.  Subjective:    Patient ID: Kylie Huerta, female    DOB: 1990-12-28, 33 y.o.   MRN: [id number]  HPI: Kylie Huerta is a 33 y.o. female  Chief Complaint  Patient presents with   Hypertension   office visit    3 week F/u. Patient stated she is having concerns about her mental health.     Discussed the use of AI scribe software for clinical note transcription with the patient, who gave verbal consent to proceed.  History of Present Illness   Kylie Huerta is a 33 year old female with hypertension and ADHD who presents for medication management and follow-up.  She experienced a change in her psychiatric care provider in November, leading to a two-month period without her medications as her previous prescriber did not authorize refills. She has resumed her medications for a week now. Her current medications include Adderall, taken as half a tablet once or twice daily, Strattera (atomoxetine), Wellbutrin (bupropion), Lamictal (lamotrigine) at 25 mg, and gabapentin. She is not taking topiramate  (Topamax ) as previously prescribed by a neurologist.  She has a history of hypertension and is currently taking amlodipine , olmesartan , and hydrochlorothiazide . She notes that her diastolic pressure tends to be higher during her menstrual cycle. She regularly monitors her blood pressure and reports it is usually elevated during her menstrual period but normal at other times.  She is planning to start Wegovy  for weight management in January when her FSA account is available to cover the cost. She is also preparing for surgery, with her next appointment scheduled for January 6th.       Relevant past medical, surgical, family and social history reviewed and updated as indicated. Interim medical history since our last visit reviewed. Allergies and medications reviewed and updated.  ROS per HPI unless specifically indicated above     Objective:    BP (!) 125/90 (BP Location: Left Arm, Cuff Size: Large)   Ht 5' 5.98 (1.676 m)   Wt 246 lb (111.6 kg)   LMP  (LMP Unknown)   BMI 39.72 kg/m   Wt Readings from Last 3 Encounters:  09/17/24 246 lb (111.6 kg)  08/26/24 246 lb 9.6 oz (111.9 kg)  08/12/24 240 lb (108.9 kg)     Physical Exam Constitutional:      General: She is not in acute distress.    Appearance: Normal appearance.  Neurological:     General: No focal deficit present.     Mental Status: She is alert. Mental status is at baseline.      LIMITED EXAM GIVEN VIDEO VISIT     Assessment & Plan:  Assessment & Plan   Essential hypertension Assessment & Plan: Blood pressure generally well-controlled with occasional elevations during menstrual periods. Current medications include amlodipine , olmesartan , and hydrochlorothiazide . Diastolic pressure slightly elevated today. May need med adjustments with future weight loss. Stimulants may contribute to BP, so monitor closely. - Submitted prescription for combined pill of amlodipine , olmesartan , and hydrochlorothiazide  with increased hydrochlorothiazide  to 25 mg. - Monitor blood pressure regularly. - Adjust medication dosage if blood pressure increases significantly  Orders: -     Olmesartan -amLODIPine -HCTZ; Take 1 tablet by mouth daily.  Dispense:  90 tablet; Refill: 1  Bipolar 2 disorder (HCC) Other specified attention deficit hyperactivity disorder (ADHD) Assessment & Plan: With comborbid ADHD. Currently managed with Adderall and atomoxetine. Recent medication adjustment due to provider change. Medications confirmed by new psychiatrist. She reports having severe episode while off medication during  psychiatry change of providers. Will update FMLA paperwork to reflect this from November. - Continue current ADHD medication regimen with Adderall and atomoxetine. - Follow up with psychiatrist in January for medication update.   BMI 39.0-39.9,adult Assessment & Plan: Management includes Wegovy , approved to start in January with FSA coverage. Surgery planned for January. May not need wegovy  if has weight loss with bariatric surgery, defer to her weight loss team recommendations. - Start Wegovy  in January with FSA coverage. - Proceed with planned surgery in January. - Monitor weight loss and adjust Wegovy  prescription if necessary.    Follow up plan: Return as needed Patient will look for new PCP, declined TOC visit  Hadassah SHAUNNA Nett, MD   This visit was completed via video visit through MyChart due to the restrictions of the COVID-19 pandemic. All issues as above were discussed and addressed. Physical exam was done as above through visual confirmation on video through MyChart. If it was felt that the patient should be evaluated in the office, they were directed there. The patient verbally consented to this visit.  Location of the patient: home Location of the provider: work Those involved with this call:  Provider: Hadassah Nett, MD CMA: Joya Louder, CMA Time spent on call: 15 minutes with patient face to face via video conference. More than 50% of this time was spent in counseling and coordination of care. 15 minutes total spent in review of patient's record and preparation of their chart. Total time spent on this encounter: 30 minutes.

## 2024-09-17 NOTE — Assessment & Plan Note (Signed)
 Management includes [employer] , approved to start in January with FSA coverage. Surgery planned for January. May not need wegovy  if has weight loss with bariatric surgery, defer to her weight loss team recommendations. - Start We[employer]  in January with FSA coverage. - Proceed with planned surgery in January. - Monitor weight loss and adjust Wegovy  prescription if necessary.

## 2024-09-24 NOTE — Telephone Encounter (Unsigned)
 Copied from CRM [id number]. Topic: General - Other >> Sep 24, 2024  8:24 AM Emylou G wrote: Reason for CRM: Patient called.. checking status of FMLA.SABRA it is uploaded in Mychart _ I did adv of turn time.. she asking it to be done by the 19th.. can you call her in regards to update

## 2024-09-25 ENCOUNTER — Telehealth: Payer: Self-pay

## 2024-09-25 NOTE — Telephone Encounter (Signed)
 Pharmacy Patient Advocate Encounter   Received notification from CoverMyMeds that prior authorization for [employer] is due for renewal.   Insurance verification completed.   The patient is insured through N/A.  Action: Patient hasn't been seen in your office in over a year. Plan requires updated chart notes for PA renewal.

## 2024-09-29 NOTE — Telephone Encounter (Unsigned)
 Copied from CRM [id number]. Topic: General - Other >> Sep 29, 2024  9:14 AM Treva T wrote: Reason for CRM: Pt calling, states FMLA forms were sent to employer, however the forms were received incomplete.  Pt is requesting forms to be completed entirely and sent back to HR department of her employer.  Pt states they need completed forms as soon as possible for job.  Area of completion needed: Section 8 of FMLA forms Dates of August 19, 2024 to August 28, 2024.   Pt is requesting a follow up via phone at [phone], or via mychart.

## 2024-10-15 ENCOUNTER — Emergency Department

## 2024-10-15 ENCOUNTER — Emergency Department
Admission: EM | Admit: 2024-10-15 | Discharge: 2024-10-15 | Disposition: A | Discharge status: Home / Self Care | Attending: Emergency Medicine | Admitting: Emergency Medicine

## 2024-10-15 ENCOUNTER — Encounter: Payer: Self-pay | Admitting: Intensive Care

## 2024-10-15 ENCOUNTER — Other Ambulatory Visit: Payer: Self-pay

## 2024-10-15 ENCOUNTER — Ambulatory Visit: Payer: Self-pay

## 2024-10-15 DIAGNOSIS — R519 Headache, unspecified: Secondary | ICD-10-CM | POA: Diagnosis not present

## 2024-10-15 DIAGNOSIS — E876 Hypokalemia: Secondary | ICD-10-CM | POA: Insufficient documentation

## 2024-10-15 DIAGNOSIS — D72819 Decreased white blood cell count, unspecified: Secondary | ICD-10-CM | POA: Insufficient documentation

## 2024-10-15 DIAGNOSIS — R002 Palpitations: Secondary | ICD-10-CM | POA: Insufficient documentation

## 2024-10-15 LAB — BASIC METABOLIC PANEL WITH GFR
Anion gap: 9 (ref 5–15)
BUN: 8 mg/dL (ref 6–20)
CO2: 26 mmol/L (ref 22–32)
Calcium: 9.8 mg/dL (ref 8.9–10.3)
Chloride: 101 mmol/L (ref 98–111)
Creatinine, Ser: 0.9 mg/dL (ref 0.44–1.00)
GFR, Estimated: >60 mL/min
Glucose, Bld: 89 mg/dL (ref 70–99)
Potassium: 3.4 mmol/L — ABNORMAL LOW (ref 3.5–5.1)
Sodium: 136 mmol/L (ref 135–145)

## 2024-10-15 LAB — CBC
HCT: 38 % (ref 36.0–46.0)
Hemoglobin: 12.2 g/dL (ref 12.0–15.0)
MCH: 27.1 pg (ref 26.0–34.0)
MCHC: 32.1 g/dL (ref 30.0–36.0)
MCV: 84.3 fL (ref 80.0–100.0)
Platelets: 249 K/uL (ref 150–400)
RBC: 4.51 MIL/uL (ref 3.87–5.11)
RDW: 12.3 % (ref 11.5–15.5)
WBC: 3.8 K/uL — ABNORMAL LOW (ref 4.0–10.5)
nRBC: 0 % (ref 0.0–0.2)

## 2024-10-15 LAB — POC URINE PREG, ED: Preg Test, Ur: NEGATIVE

## 2024-10-15 LAB — TROPONIN T, HIGH SENSITIVITY
Troponin T High Sensitivity: <15 ng/L (ref 0–19)
Troponin T High Sensitivity: <15 ng/L (ref 0–19)

## 2024-10-15 MED ORDER — METOCLOPRAMIDE HCL 5 MG/ML IJ SOLN
10.0000 mg | Freq: Once | INTRAMUSCULAR | Status: AC
  Administered 2024-10-15: 10 mg via INTRAVENOUS
  Filled 2024-10-15: qty 2

## 2024-10-15 MED ORDER — SODIUM CHLORIDE 0.9 % IV BOLUS
1000.0000 mL | Freq: Once | INTRAVENOUS | Status: AC
  Administered 2024-10-15: 1000 mL via INTRAVENOUS

## 2024-10-15 MED ORDER — DIPHENHYDRAMINE HCL 50 MG/ML IJ SOLN
12.5000 mg | Freq: Once | INTRAMUSCULAR | Status: AC
  Administered 2024-10-15: 12.5 mg via INTRAVENOUS
  Filled 2024-10-15: qty 1

## 2024-10-15 MED ORDER — ACETAMINOPHEN 500 MG PO TABS
1000.0000 mg | ORAL_TABLET | Freq: Once | ORAL | Status: AC
  Administered 2024-10-15: 1000 mg via ORAL
  Filled 2024-10-15: qty 2

## 2024-10-15 MED ORDER — POTASSIUM CHLORIDE CRYS ER 20 MEQ PO TBCR
40.0000 meq | EXTENDED_RELEASE_TABLET | Freq: Once | ORAL | Status: AC
  Administered 2024-10-15: 40 meq via ORAL
  Filled 2024-10-15: qty 2

## 2024-10-15 NOTE — ED Provider Notes (Signed)
 "  [facility] Provider Note    Event Date/Time   First MD Initiated Contact with Patient 10/15/24 1220     (approximate)   History   Palpitations and Hypertension   HPI  Kylie Huerta is a 34 y.o. female who comes in with concerns for palpitations this morning with elevated blood pressure.  Patient reports that she woke up around 5 AM and felt her normal self.  She reports that she took her medications and then soon after she started to have a headache.  She reports chronic headaches and this 1 gradually increased and seem to be a little bit more severe than her baseline headaches although otherwise similar character.  She denies any numbness, tingling.  She denies it being sudden and severe in onset.  She reports that she then developed some palpitations around 8 to 9 AM and so she took her blood pressure and noticed it was elevated in the 190 systolic.  She called her primary care doctor who told her to come in to the emergency room to be evaluated.  She did report feeling a little short of breath during the palpitation but denies any shortness of breath, chest pain, vision changes or any other concerns at this time. Denies any risk factors for PE.   She reports being frustrated as she has been dealing with chronic headaches for a long time.  She reports coming into the hospital and I reviewed the workup where patient was admitted on 08/21/2023 and had full TIA stroke, workup.  She states that she has followed up with neurology since then and they can still not figure out why she continues to have chronic headaches.  She states that every time she comes here the same things are happening and so she reports being frustrated that she was even told to come in to be evaluated.     Physical Exam   Triage Vital Signs: ED Triage Vitals  Encounter Vitals Group     BP 10/15/24 1109 (!) 135/95     Girls Systolic BP Percentile --      Girls Diastolic BP Percentile --       Boys Systolic BP Percentile --      Boys Diastolic BP Percentile --      Pulse Rate 10/15/24 1109 84     Resp 10/15/24 1109 16     Temp 10/15/24 1109 98.5 F (36.9 C)     Temp Source 10/15/24 1109 Oral     SpO2 10/15/24 1109 99 %     Weight 10/15/24 1107 230 lb (104.3 kg)     Height 10/15/24 1107 5' 8 (1.727 m)     Head Circumference --      Peak Flow --      Pain Score 10/15/24 1106 8     Pain Loc --      Pain Education --      Exclude from Growth Chart --     Most recent vital signs: Vitals:   10/15/24 1109  BP: (!) 135/95  Pulse: 84  Resp: 16  Temp: 98.5 F (36.9 C)  SpO2: 99%     General: Awake, no distress.  CV:  Good peripheral perfusion.  Resp:  Normal effort.  Abd:  No distention.  Soft and nontender Other:  Cranial nerves II through XII are intact.  Equal strength in arms and legs.  Sensation is intact.   ED Results / Procedures / Treatments   Labs (all  labs ordered are listed, but only abnormal results are displayed) Labs Reviewed  BASIC METABOLIC PANEL WITH GFR - Abnormal; Notable for the following components:      Result Value   Potassium 3.4 (*)    All other components within normal limits  CBC - Abnormal; Notable for the following components:   WBC 3.8 (*)    All other components within normal limits  TROPONIN T, HIGH SENSITIVITY     EKG  My interpretation of EKG:  Normal sinus rate of 83 without any ST elevation, T wave inversion in lead III, normal intervals  Reviewed prior EKG back from November 2024 where patient had rightward axis deviation with T wave inversion in lead III previously  RADIOLOGY I have reviewed the xray personally and interpreted no evidence of any pneumonia   PROCEDURES:  Critical Care performed: No  Procedures   MEDICATIONS ORDERED IN ED: Medications  potassium chloride  SA (KLOR-CON  M) CR tablet 40 mEq (40 mEq Oral Given 10/15/24 1326)  metoCLOPramide  (REGLAN ) injection 10 mg (10 mg Intravenous Given  10/15/24 1328)  diphenhydrAMINE  (BENADRYL ) injection 12.5 mg (12.5 mg Intravenous Given 10/15/24 1328)  acetaminophen  (TYLENOL ) tablet 1,000 mg (1,000 mg Oral Given 10/15/24 1326)  sodium chloride  0.9 % bolus 1,000 mL (1,000 mLs Intravenous New Bag/Given 10/15/24 1331)     IMPRESSION / MDM / ASSESSMENT AND PLAN / ED COURSE  I reviewed the triage vital signs and the nursing notes.   Patient's presentation is most consistent with acute presentation with potential threat to life or bodily function.  Patient comes in with headache, currently normotensive.  She reports gradually increasing headache but given she did report that it was worse than her prior headache I did recommend getting CT imaging to make sure no evidence of intracranial hemorrhage or SAH (seems less likely given no suddent severe onset).  Patient stating that she has already had multiple imaging done of her head in the past that have all been negative.  She reports that most notably was back in 2024 where DC summary reviewed where she had both CT head CTA MRA/mrv that were all negative.  She states that she is not interested in having imaging done today.  We did discuss getting a repeat troponin to ensure the palpitations were not anything cardiac.  We can refer her to cardiology for this.  Blood work was ordered to evaluate for [employer] abnormalities, AKI.  I considered pulmonary embolism but she denies any risk factors for PE she is PERC negative her EKG is unchanged from her priors and she reports resolution of palpitations, shortness of breath.  Patient was willing to undergo a migraine cocktail to help with symptoms  Troponin was negative BMP shows potassium of 3.4 CBC shows slightly low white count of 3.8  Repeat troponin was negative.  Preg test negative  2:19 PM patient reports feeling much improved after migraine cocktail.  She continues to deny any shortness of breath and reports feeling comfortable with discharge home.  Discussed  the importance of following up with neurology if she does have migraines she may need to be started on additional medications for these.  She expressed understanding.  Offered her cardiology referral but she declined saying that she has a cardiologist she can follow-up with.  Discussed that she should talk to them about a Holter monitor.   The patient is on the cardiac monitor to evaluate for evidence of arrhythmia and/or significant heart rate changes.      FINAL  CLINICAL IMPRESSION(S) / ED DIAGNOSES   Final diagnoses:  Palpitations  Intractable headache, unspecified chronicity pattern, unspecified headache type     Rx / DC Orders   ED Discharge Orders     None        Note:  This document was prepared using Dragon voice recognition software and may include unintentional dictation errors.   Ernest Ronal BRAVO, MD 10/15/24 1420  "

## 2024-10-15 NOTE — Discharge Instructions (Signed)
 You should follow-up with cardiology as well as neurology to discuss the symptoms.  You may need Holter monitor and further medications to help with headaches if this could be related to migraines.  There are other therapies that can be more helpful that we typically cannot start from the emergency room.  If you develop fevers, worsening symptoms or any other concerns please return to your emergency room for repeat evaluation

## 2024-10-15 NOTE — Telephone Encounter (Signed)
 FYI Only or Action Required?: FYI only for provider: ED advised. Called 911 for the pt  Patient was last seen in primary care on 09/17/2024 by Herold Hadassah SQUIBB, MD.  Called Nurse Triage reporting Hypertension.  Symptoms began today.  Interventions attempted: Prescription medications: omlesartan-amlodipine -hydrochlorothiazide  and Rest, hydration, or home remedies.  Symptoms are: gradually worsening.  Triage Disposition: Go to ED Now (Notify PCP)  Patient/caregiver understands and will follow disposition?: Yes    Pt with hx of htn and TIA reports waking up this morning with headache. Takes adderal and bupropion and is common for her to get headaches with these meds. This morning however BP has been abnormally elevated. Today was 196/124 then 161/106. SBP ranges 130 to 150 normally. 176/111, HR 84 when checked over the phone. Takes omlesartan-amlodipine -hydrochlorothiazide  daily, has not missed any doses.   Has been having palpitations which feels like her heart is fluttering. Speech feels a little slow. Reports these 2 symptoms normally happen when her BP gets elevated and has gotten up to 200 in the past. Pt noted to be speaking in clear full continuous sentences. No confusion. No CP. Having some SOB with exertion. No weakness or numbness. No changes to vision. No difficulty walking. Advised the ED, pt does not have a car and would need to call 911. Agreeable to go. This RN called 911 for the pt. EMS in route. Pts mom there with pt now.     Copied from CRM [id number]. Topic: Clinical - Red Word Triage >> Oct 15, 2024 10:12 AM Kylie Huerta wrote: Red Word that prompted transfer to Nurse Triage: Blood pressure 196/124 at 9:17 am  Heart palpitations Reason for Disposition  [1] Systolic BP >= 160 OR Diastolic >= 100 AND [2] cardiac (e.g., breathing difficulty, chest pain) or neurologic symptoms (e.g., new-onset blurred or double vision, unsteady gait)  Answer Assessment - Initial Assessment  Questions 1. BLOOD PRESSURE: What is your blood pressure? Did you take at least two measurements 5 minutes apart?     196/124 then 161/106 this morning. 176/111, HR 84 checked over the phone with this RN  2. ONSET: When did you take your blood pressure?     This morning  3. HOW: How did you take your blood pressure? (e.g., automatic home BP monitor, visiting nurse)     Home cuff  4. HISTORY: Do you have a history of high blood pressure?     Yes  5. MEDICINES: Are you taking any medicines for blood pressure? Have you missed any doses recently?     omlesartan-amlodipine -hydrochlorothiazide   6. OTHER SYMPTOMS: Do you have any symptoms? (e.g., blurred vision, chest pain, difficulty breathing, headache, weakness)     Slightly slower speech. Mild SOB, Palpitations  7. PREGNANCY: Is there any chance you are pregnant? When was your last menstrual period?     *No Answer*  Protocols used: Blood Pressure - High-A-AH

## 2024-10-15 NOTE — Telephone Encounter (Signed)
 Patient currently at the ER.

## 2024-10-15 NOTE — ED Triage Notes (Signed)
 Arrived by [facility] from home. Patient c/o hypertension and palpitations this AM. NAD noted in triage. A&O x4
